# Patient Record
Sex: Male | Born: 1980 | Race: Black or African American | Hispanic: No | Marital: Single | Smoking: Current every day smoker
Health system: Southern US, Community
[De-identification: ages and names within clinical notes are randomized; demographics above are authoritative.]

## PROBLEM LIST (undated history)

## (undated) DIAGNOSIS — F1994 Other psychoactive substance use, unspecified with psychoactive substance-induced mood disorder: Secondary | ICD-10-CM

## (undated) DIAGNOSIS — F319 Bipolar disorder, unspecified: Principal | ICD-10-CM

## (undated) DIAGNOSIS — G4733 Obstructive sleep apnea (adult) (pediatric): Secondary | ICD-10-CM

## (undated) DIAGNOSIS — N529 Male erectile dysfunction, unspecified: Secondary | ICD-10-CM

## (undated) DIAGNOSIS — J0100 Acute maxillary sinusitis, unspecified: Secondary | ICD-10-CM

## (undated) DIAGNOSIS — M549 Dorsalgia, unspecified: Secondary | ICD-10-CM

## (undated) DIAGNOSIS — M5431 Sciatica, right side: Secondary | ICD-10-CM

## (undated) DIAGNOSIS — L0100 Impetigo, unspecified: Secondary | ICD-10-CM

## (undated) DIAGNOSIS — K047 Periapical abscess without sinus: Principal | ICD-10-CM

## (undated) DIAGNOSIS — J4520 Mild intermittent asthma, uncomplicated: Secondary | ICD-10-CM

## (undated) DIAGNOSIS — W3400XA Accidental discharge from unspecified firearms or gun, initial encounter: Secondary | ICD-10-CM

## (undated) DIAGNOSIS — T148XXA Other injury of unspecified body region, initial encounter: Secondary | ICD-10-CM

## (undated) DIAGNOSIS — J45909 Unspecified asthma, uncomplicated: Secondary | ICD-10-CM

## (undated) DIAGNOSIS — F1110 Opioid abuse, uncomplicated: Secondary | ICD-10-CM

## (undated) DIAGNOSIS — I10 Essential (primary) hypertension: Secondary | ICD-10-CM

## (undated) DIAGNOSIS — R1115 Cyclical vomiting syndrome unrelated to migraine: Secondary | ICD-10-CM

## (undated) DIAGNOSIS — R600 Localized edema: Secondary | ICD-10-CM

## (undated) DIAGNOSIS — M7989 Other specified soft tissue disorders: Secondary | ICD-10-CM

## (undated) DIAGNOSIS — J4531 Mild persistent asthma with (acute) exacerbation: Secondary | ICD-10-CM

## (undated) DIAGNOSIS — L0591 Pilonidal cyst without abscess: Secondary | ICD-10-CM

## (undated) DIAGNOSIS — R29818 Other symptoms and signs involving the nervous system: Secondary | ICD-10-CM

## (undated) DIAGNOSIS — Z889 Allergy status to unspecified drugs, medicaments and biological substances status: Secondary | ICD-10-CM

## (undated) DIAGNOSIS — F32A Depression, unspecified: Secondary | ICD-10-CM

---

## 2008-11-25 MED ORDER — METRONIDAZOLE 500 MG TAB
500 mg | ORAL_TABLET | Freq: Two times a day (BID) | ORAL | Status: AC
Start: 2008-11-25 — End: 2008-11-29

## 2008-11-25 MED ORDER — PROPOXYPHENE N-ACETAMINOPHEN 100 MG-650 MG TAB
100-650 mg | ORAL_TABLET | Freq: Four times a day (QID) | ORAL | Status: AC | PRN
Start: 2008-11-25 — End: 2008-12-02

## 2008-11-25 MED ORDER — PENICILLIN V-K 500 MG TAB
500 mg | ORAL_TABLET | Freq: Three times a day (TID) | ORAL | Status: AC
Start: 2008-11-25 — End: 2008-12-02

## 2008-11-25 MED ADMIN — cefTRIAXone (ROCEPHIN) 1 g in lidocaine (PF) (XYLOCAINE) 10 mg/mL (1 %) IM injection: INTRAMUSCULAR | @ 16:00:00 | NDC 63323049205

## 2008-11-25 MED ADMIN — acetaminophen (TYLENOL) tablet 1,000 mg: ORAL | @ 16:00:00 | NDC 51645070610

## 2008-11-25 MED FILL — ACETAMINOPHEN 500 MG TAB: 500 mg | ORAL | Qty: 2

## 2008-11-25 MED FILL — XYLOCAINE-MPF 10 MG/ML (1 %) INJECTION SOLUTION: 10 mg/mL (1 %) | INTRAMUSCULAR | Qty: 5

## 2008-11-25 NOTE — ED Provider Notes (Signed)
I have personally seen and evaluated patient. I find the patient's history and physical exam are consistent with the PA's NP documentation. I agree with the care provided, treatments rendered, disposition and follow up plan.

## 2008-11-25 NOTE — ED Provider Notes (Signed)
Patient is a 28 y.o. male presenting with tooth pain. The history is provided by the patient.   Dental Pain   This is a new problem. The current episode started 2 days ago. The problem occurs constantly. The problem has not changed since onset. The pain is located in the left lower mouth.The quality of the pain is aching.  The pain is at a severity of 9/10. The pain is mild. There was no vomiting, no nausea, no fever, no swelling, no chest pain, no shortness of breath, no headaches and no gum redness. He has tried nothing for the symptoms. The treatment provided no relief. The patient has no cardiac history.       No past medical history on file.     No past surgical history on file.      No family history on file.     History   Social History   ??? Marital Status: Married     Spouse Name: N/A     Number of Children: N/A   ??? Years of Education: N/A   Occupational History   ??? Not on file.   Social History Main Topics   ??? Tobacco Use: Not on file   ??? Alcohol Use: Not on file   ??? Drug Use: Not on file   ??? Sexually Active: Not on file   Other Topics Concern   ??? Not on file   Social History Narrative   ??? No narrative on file           ALLERGIES: Ibuprofen, Hydrocodone, Strawberry and Tomato      Review of Systems   Constitutional: Negative for fever, chills, diaphoresis, appetite change and fatigue.   HENT: Positive for dental problem. Negative for rhinorrhea, mouth sores and trouble swallowing.    Eyes: Negative.    Respiratory: Negative.    Cardiovascular: Negative.    Genitourinary: Negative.    Musculoskeletal: Negative.    Skin: Negative.    Neurological: Negative.        Filed Vitals:    11/25/2008 10:13 AM   BP: 136/88   Pulse: 121   Temp: 99.7 ??F (37.6 ??C)   Resp: 16   Weight: 246 lb (111.585 kg)   SpO2: 99%              Physical Exam   Nursing note and vitals reviewed.  Constitutional: He is oriented to person, place, and time. He appears well-developed and well-nourished.   HENT:    Head: Normocephalic and atraumatic.   Right Ear: External ear normal.   Left Ear: External ear normal.   Mouth/Throat: Oropharynx is clear and moist. No oropharyngeal exudate.        Eyes: Conjunctivae and extraocular motions are normal. Pupils are equal, round, and reactive to light. Right eye exhibits no discharge. Left eye exhibits no discharge. No scleral icterus.   Neck: Normal range of motion. Neck supple. No tracheal deviation present. No thyromegaly present.   Cardiovascular: Normal rate, regular rhythm, normal heart sounds and intact distal pulses.    No murmur heard.  Pulmonary/Chest: Effort normal and breath sounds normal. No respiratory distress. He has no wheezes. He has no rales.   Abdominal: Soft. He exhibits no distension. No tenderness. He has no rebound and no guarding.   Musculoskeletal: Normal range of motion. He exhibits no edema and no tenderness.   Lymphadenopathy:     He has no cervical adenopathy.   Neurological: He is alert and oriented to  person, place, and time. No cranial nerve deficit. Coordination normal.   Skin: Skin is warm. No rash noted. No erythema.   Psychiatric: He has a normal mood and affect. His behavior is normal. Judgment and thought content normal.        Coding    Procedures

## 2008-12-10 NOTE — ED Notes (Signed)
Pt called to treatment area. No answer.

## 2008-12-10 NOTE — ED Notes (Signed)
Patient called twice with no answer.

## 2008-12-11 ENCOUNTER — Inpatient Hospital Stay
Admit: 2008-12-11 | Discharge: 2008-12-11 | Disposition: A | Discharge status: Home / Self Care | Payer: Self-pay | Attending: Emergency Medicine

## 2008-12-11 NOTE — ED Provider Notes (Addendum)
Patient left before being seen

## 2009-01-14 MED ORDER — DIAZEPAM 5 MG TAB
5 mg | ORAL_TABLET | Freq: Every evening | ORAL | Status: AC
Start: 2009-01-14 — End: 2009-01-17

## 2009-01-14 MED ORDER — ACETAMINOPHEN SR 650 MG TAB
650 mg | ORAL_TABLET | Freq: Two times a day (BID) | ORAL | Status: AC
Start: 2009-01-14 — End: 2009-01-19
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
## 2009-01-14 NOTE — ED Provider Notes (Signed)
I have personally seen and evaluated patient. I find the patient's history and physical exam are consistent with the PA's NP documentation. I agree with the care provided, treatments rendered, disposition and follow up plan.
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
## 2009-01-14 NOTE — ED Notes (Signed)
Patient given DC instructions and 2 scripts.  Patient verbalized understanding of instructions and scripts.  Patient alert and oriented and in no acute distress.  Patient ambulated out of ED with self.  Patient given the opportunity to ask questions prior to discharge.

## 2009-12-15 NOTE — ED Notes (Signed)
Pt states, "my doctor is going to call me in something"; denies wanting to be seen in ED anymore

## 2009-12-15 NOTE — ED Notes (Signed)
Pt on phone, reports wanting to finish phone call prior to being triaged; pt speaking with his doctor about getting pain medications

## 2010-02-08 MED ORDER — NAPROXEN 500 MG TAB
500 mg | ORAL_TABLET | Freq: Two times a day (BID) | ORAL | Status: AC
Start: 2010-02-08 — End: 2010-02-11

## 2010-02-08 MED ORDER — ACETAMINOPHEN-CODEINE 300 MG-30 MG TAB
300-30 mg | ORAL_TABLET | Freq: Four times a day (QID) | ORAL | Status: AC | PRN
Start: 2010-02-08 — End: 2010-02-11

## 2010-02-08 MED ORDER — TRIMETHOPRIM-SULFAMETHOXAZOLE 160 MG-800 MG TAB
160-800 mg | ORAL_TABLET | Freq: Two times a day (BID) | ORAL | Status: AC
Start: 2010-02-08 — End: 2010-02-18

## 2010-02-08 MED ORDER — CETIRIZINE 10 MG TAB
10 mg | ORAL_TABLET | Freq: Every day | ORAL | Status: AC
Start: 2010-02-08 — End: 2010-02-15

## 2010-02-08 NOTE — ED Provider Notes (Signed)
I have personally seen and evaluated patient. I find the patient's history and physical exam are consistent with the PA's NP documentation. I agree with the care provided, treatments rendered, disposition and follow up plan.

## 2010-02-08 NOTE — ED Notes (Signed)
Patient (s)  given copy of dc instructions and 4 script(s).  Patient (s)  verbalized understanding of instructions and script (s).  Patient given a current medication reconciliation form and verbalized understanding of their medications.   Patient (s) verbalized understanding of the importance of discussing medications with  his or her physician or clinic they will be following up with.  Patient alert and oriented and in no acute distress.  Patient discharged home ambulatory with self.
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
## 2010-03-25 MED ORDER — IPRATROPIUM-ALBUTEROL 2.5 MG-0.5 MG/3 ML NEB SOLUTION
2.5 mg-0.5 mg/3 ml | Freq: Once | RESPIRATORY_TRACT | Status: AC
Start: 2010-03-25 — End: 2010-03-25
  Administered 2010-03-25: via RESPIRATORY_TRACT
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
## 2010-03-25 NOTE — ED Notes (Signed)
Report received from [facility], RN for transfer of care. Pt currently in X-ray at this time. No distress noted prior to transport.

## 2010-03-25 NOTE — ED Notes (Signed)
The patient is alert and oriented with no respiratory distress observed.Vital signs within normal limits . The patient's diagnosis, condition and treatment were explained to patient. The patient expressed understanding. Prescription (5) given to patient. The patient was discharged home ambulatory in stable condition.  A discharge plan has been developed. A case manager was not involved in the process. Aftercare instructions were given to the patient.

## 2010-03-26 MED ORDER — AZITHROMYCIN 250 MG TAB
250 mg | PACK | ORAL | Status: AC
Start: 2010-03-26 — End: 2010-03-30

## 2010-03-26 MED ORDER — ALBUTEROL 90 MCG/ACTUATION AEROSOL INHALER
90 mcg/actuation | RESPIRATORY_TRACT | Status: AC | PRN
Start: 2010-03-26 — End: 2011-03-20

## 2010-03-26 MED ORDER — PROMETHAZINE-CODEINE 6.25 MG-10 MG/5 ML SYRUP
Freq: Four times a day (QID) | ORAL | Status: AC | PRN
Start: 2010-03-26 — End: 2010-03-30

## 2010-03-26 MED ORDER — FAMOTIDINE 20 MG TAB
20 mg | ORAL_TABLET | Freq: Two times a day (BID) | ORAL | Status: AC
Start: 2010-03-26 — End: 2010-04-04

## 2010-03-26 MED ORDER — PREDNISONE 10 MG TABLETS IN A DOSE PACK
10 mg | ORAL_TABLET | ORAL | Status: DC
Start: 2010-03-26 — End: 2011-01-14

## 2010-03-26 MED ORDER — CETIRIZINE 10 MG TAB
10 mg | ORAL_TABLET | Freq: Every day | ORAL | Status: AC
Start: 2010-03-26 — End: 2010-04-01

## 2010-03-26 NOTE — ED Provider Notes (Signed)
I have personally seen and evaluated patient. I find the patient's history and physical exam are consistent with the PA's NP documentation. I agree with the care provided, treatments rendered, disposition and follow up plan.
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
## 2011-01-14 MED ORDER — PREDNISONE 5 MG TABLETS IN A DOSE PACK
5 mg | ORAL_TABLET | ORAL | Status: DC
Start: 2011-01-14 — End: 2011-08-11

## 2011-01-14 MED ORDER — HYDROCODONE-HOMATROPINE 5 MG-1.5 MG/5 ML ORAL SOLUTION
ORAL | Status: DC | PRN
Start: 2011-01-14 — End: 2011-06-01

## 2011-01-14 MED ORDER — OXYCODONE-ACETAMINOPHEN 5 MG-325 MG TAB
5-325 mg | ORAL | Status: AC
Start: 2011-01-14 — End: 2011-01-14
  Administered 2011-01-14: 20:00:00 via ORAL

## 2011-01-14 NOTE — ED Notes (Signed)
Cast shoe applied
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
## 2011-01-14 NOTE — ED Notes (Signed)
Patient has been given discharge instructions by PA and patient was given the opportunity to ask questions.  Nurse also reinforced discharge instructions and patient has verbalized understanding.

## 2011-01-16 NOTE — ED Provider Notes (Signed)
I was personally available for consultation in the emergency department.  I have reviewed the chart and agree with the documentation recorded by the MLP, including the assessment, treatment plan, and disposition.  Michael S Donella Pascarella, MD

## 2011-06-01 MED ORDER — NAPROXEN 250 MG TAB
250 mg | ORAL | Status: DC
Start: 2011-06-01 — End: 2011-06-01
  Administered 2011-06-01: 05:00:00 via ORAL

## 2011-06-01 MED ORDER — NAPROXEN 250 MG TAB
250 mg | ORAL | Status: AC
Start: 2011-06-01 — End: 2011-06-01
  Administered 2011-06-01: 05:00:00 via ORAL

## 2011-06-01 MED ORDER — NAPROXEN 500 MG TAB
500 mg | ORAL_TABLET | Freq: Two times a day (BID) | ORAL | Status: DC | PRN
Start: 2011-06-01 — End: 2011-08-11

## 2011-06-01 MED ORDER — HYDROCODONE-ACETAMINOPHEN 5 MG-325 MG TAB
5-325 mg | ORAL | Status: AC
Start: 2011-06-01 — End: 2011-06-01
  Administered 2011-06-01: 05:00:00 via ORAL

## 2011-06-01 MED ORDER — HYDROCODONE-HOMATROPINE 5 MG-1.5 MG/5 ML ORAL SOLUTION
Freq: Three times a day (TID) | ORAL | Status: DC | PRN
Start: 2011-06-01 — End: 2011-08-11

## 2011-06-01 MED ORDER — BENZONATATE 200 MG CAP
200 mg | ORAL_CAPSULE | Freq: Three times a day (TID) | ORAL | Status: AC | PRN
Start: 2011-06-01 — End: 2011-06-08

## 2011-06-01 MED FILL — NAPROXEN 250 MG TAB: 250 mg | ORAL | Qty: 2

## 2011-06-01 MED FILL — HYDROCODONE-ACETAMINOPHEN 5 MG-325 MG TAB: 5-325 mg | ORAL | Qty: 2

## 2011-06-01 NOTE — ED Notes (Signed)
Pt discharged to home, instructions reviewed with pt by PA

## 2011-06-01 NOTE — ED Provider Notes (Signed)
I was personally available for consultation in the emergency department.  I have reviewed the chart and agree with the documentation recorded by the MLP, including the assessment, treatment plan, and disposition.  Khara Renaud E Karisha Marlin, MD
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
## 2011-06-01 NOTE — ED Notes (Signed)
Pt to xray
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
## 2011-08-11 NOTE — ED Notes (Signed)
Pt reports to ed complaining of wheezing, shortness of breath, sore throat, and left flank pain onset today

## 2011-08-11 NOTE — ED Notes (Signed)
Dr. Bradshaw reviewed discharge instructions with the patient.  The patient verbalized understanding.  Patient ambulatory out of ED with discharge instructions in hand.

## 2011-08-11 NOTE — ED Notes (Signed)
Patient c/o coughing, drainage in back of throat, sinus headache and chest congestion.

## 2011-08-11 NOTE — ED Notes (Signed)
Patient found to be eating bag of pretzels in bed.  Patient reported he no longer has nausea.

## 2011-08-12 LAB — CK-MB,QUANT.
CK - MB: 1.8 NG/ML (ref 0.5–3.6)
CK-MB Index: 0.3 (ref 0–2.5)

## 2011-08-12 LAB — METABOLIC PANEL, COMPREHENSIVE
A-G Ratio: 1.2 (ref 1.1–2.2)
ALT (SGPT): 25 U/L (ref 12–78)
AST (SGOT): 24 U/L (ref 15–37)
Albumin: 3.8 g/dL (ref 3.5–5.0)
Alk. phosphatase: 94 U/L (ref 50–136)
Anion gap: 6 mmol/L (ref 5–15)
BUN/Creatinine ratio: 13 (ref 12–20)
BUN: 10 MG/DL (ref 6–20)
Bilirubin, total: 0.3 MG/DL (ref 0.2–1.0)
CO2: 31 MMOL/L (ref 21–32)
Calcium: 8.5 MG/DL (ref 8.5–10.1)
Chloride: 105 MMOL/L (ref 97–108)
Creatinine: 0.75 MG/DL (ref 0.45–1.15)
GFR est AA: >60 mL/min/{1.73_m2} (ref >60)
GFR est non-AA: >60 mL/min/{1.73_m2} (ref >60)
Globulin: 3.3 g/dL (ref 2.0–4.0)
Glucose: 120 MG/DL — ABNORMAL HIGH (ref 65–100)
Potassium: 3.7 MMOL/L (ref 3.5–5.1)
Protein, total: 7.1 g/dL (ref 6.4–8.2)
Sodium: 142 MMOL/L (ref 136–145)

## 2011-08-12 LAB — CBC WITH AUTOMATED DIFF
ABS. BASOPHILS: 0 10*3/uL (ref 0.0–0.1)
ABS. EOSINOPHILS: 0.3 10*3/uL (ref 0.0–0.4)
ABS. LYMPHOCYTES: 2.4 10*3/uL (ref 0.8–3.5)
ABS. MONOCYTES: 0.5 10*3/uL (ref 0.0–1.0)
ABS. NEUTROPHILS: 8.2 10*3/uL — ABNORMAL HIGH (ref 1.8–8.0)
BASOPHILS: 0 % (ref 0–1)
EOSINOPHILS: 3 % (ref 0–7)
HCT: 35.2 % — ABNORMAL LOW (ref 36.6–50.3)
HGB: 12.3 g/dL (ref 12.1–17.0)
LYMPHOCYTES: 21 % (ref 12–49)
MCH: 29.3 PG (ref 26.0–34.0)
MCHC: 34.9 g/dL (ref 30.0–36.5)
MCV: 83.8 FL (ref 80.0–99.0)
MONOCYTES: 5 % (ref 5–13)
NEUTROPHILS: 71 % (ref 32–75)
PLATELET: 279 10*3/uL (ref 150–400)
RBC: 4.2 M/uL (ref 4.10–5.70)
RDW: 12.8 % (ref 11.5–14.5)
WBC: 11.4 10*3/uL — ABNORMAL HIGH (ref 4.1–11.1)

## 2011-08-12 LAB — TROPONIN I: Troponin-I, Qt.: <0.04 ng/mL (ref <0.05)

## 2011-08-12 LAB — CK W/ REFLX CKMB: CK: 539 U/L — ABNORMAL HIGH (ref 39–308)

## 2011-08-12 MED ORDER — AZITHROMYCIN 250 MG TAB
250 mg | PACK | ORAL | Status: DC
Start: 2011-08-12 — End: 2011-09-03

## 2011-08-12 MED ORDER — HYDROCODONE 10 MG-CHLORPHENIRAMINE 8 MG/5 ML ORAL SUSP EXTEND.REL 12HR
10-8 mg/5 mL | Freq: Two times a day (BID) | ORAL | Status: DC | PRN
Start: 2011-08-12 — End: 2011-09-03

## 2011-08-12 MED ORDER — PREDNISONE 10 MG TABLETS IN A DOSE PACK
10 mg | ORAL_TABLET | ORAL | Status: DC
Start: 2011-08-12 — End: 2011-09-03

## 2011-08-12 MED ORDER — ONDANSETRON 4 MG TAB, RAPID DISSOLVE
4 mg | ORAL | Status: AC
Start: 2011-08-12 — End: 2011-08-11
  Administered 2011-08-12: 02:00:00 via ORAL

## 2011-08-12 MED FILL — ONDANSETRON 4 MG TAB, RAPID DISSOLVE: 4 mg | ORAL | Qty: 1

## 2011-08-13 LAB — EKG, 12 LEAD, INITIAL
Atrial Rate: 96 {beats}/min
Calculated P Axis: 73 degrees
Calculated R Axis: 40 degrees
Calculated T Axis: 83 degrees
Diagnosis: NORMAL
P-R Interval: 136 ms
Q-T Interval: 310 ms
QRS Duration: 86 ms
QTC Calculation (Bezet): 391 ms
Ventricular Rate: 96 {beats}/min

## 2011-09-03 MED ORDER — HYDROCODONE 10 MG-CHLORPHENIRAMINE 8 MG/5 ML ORAL SUSP EXTEND.REL 12HR
10-8 mg/5 mL | Freq: Two times a day (BID) | ORAL | Status: DC | PRN
Start: 2011-09-03 — End: 2012-01-05

## 2011-09-03 MED ORDER — PENICILLIN V-K 500 MG TAB
500 mg | ORAL_TABLET | Freq: Four times a day (QID) | ORAL | Status: AC
Start: 2011-09-03 — End: 2011-09-13

## 2011-09-03 MED ADMIN — HYDROcodone-acetaminophen (NORCO) 5-325 mg per tablet 1 Tab: ORAL | @ 20:00:00 | NDC 68084036811

## 2011-09-03 MED FILL — HYDROCODONE-ACETAMINOPHEN 5 MG-325 MG TAB: 5-325 mg | ORAL | Qty: 1

## 2011-09-03 NOTE — ED Notes (Signed)
Pain to the top of the right foot.  Denies specific injury.  Pain to left lower molar area; patient states "I think I have an abscess forming".
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
## 2011-09-03 NOTE — ED Provider Notes (Signed)
I was personally available for consultation in the emergency department.  I have reviewed the chart and agree with the documentation recorded by the MLP, including the assessment, treatment plan, and disposition.  Hudson Majkowski R Erasmo Vertz, MD

## 2011-09-03 NOTE — ED Notes (Signed)
Cast shoe applied. Pt ambulates without difficulty. Pt stable for discharge.cc

## 2011-09-10 MED ORDER — OXYCODONE-ACETAMINOPHEN 5 MG-325 MG TAB
5-325 mg | ORAL_TABLET | ORAL | Status: DC | PRN
Start: 2011-09-10 — End: 2012-01-05

## 2011-09-10 MED ORDER — ONDANSETRON 4 MG TAB, RAPID DISSOLVE
4 mg | ORAL_TABLET | Freq: Three times a day (TID) | ORAL | Status: AC | PRN
Start: 2011-09-10 — End: 2011-09-20

## 2011-09-10 MED ORDER — PREDNISONE 20 MG TAB
20 mg | ORAL_TABLET | Freq: Every day | ORAL | Status: AC
Start: 2011-09-10 — End: 2011-09-15

## 2011-09-10 MED ORDER — AMOXICILLIN CLAVULANATE 875 MG-125 MG TAB
875-125 mg | ORAL_TABLET | Freq: Two times a day (BID) | ORAL | Status: DC
Start: 2011-09-10 — End: 2012-01-05

## 2011-09-10 MED ADMIN — predniSONE (DELTASONE) tablet 60 mg: ORAL | @ 19:00:00 | NDC 00054001825

## 2011-09-10 MED ADMIN — oxyCODONE-acetaminophen (PERCOCET) 5-325 mg per tablet 2 Tab: ORAL | @ 19:00:00 | NDC 68084035511

## 2011-09-10 MED ADMIN — amoxicillin-clavulanate (AUGMENTIN) 875-125 mg per tablet 1 Tab: ORAL | @ 19:00:00 | NDC 00781185220

## 2011-09-10 MED FILL — PREDNISONE 20 MG TAB: 20 mg | ORAL | Qty: 3

## 2011-09-10 MED FILL — AMOXICILLIN CLAVULANATE 875 MG-125 MG TAB: 875-125 mg | ORAL | Qty: 1

## 2011-09-10 MED FILL — OXYCODONE-ACETAMINOPHEN 5 MG-325 MG TAB: 5-325 mg | ORAL | Qty: 2

## 2011-09-10 NOTE — ED Notes (Signed)
Triage ntoe: pt arrives for left ear swelling that began a few days ago. On inspection the ear does have swelling. Pt denies any changes in medication or problems with earrings.
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
## 2011-09-20 NOTE — ED Provider Notes (Signed)
I was personally available for consultation in the emergency department.  I have reviewed the chart and agree with the documentation recorded by the MLP, including the assessment, treatment plan, and disposition.  Chairty Toman C Ranell Finelli, MD
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
## 2011-10-09 NOTE — Patient Instructions (Signed)
Upper Respiratory Infection: After Your Visit to the Emergency Room  Your Care Instructions  You were seen in the emergency room for an upper respiratory infection (URI). This is an infection of the nose, sinuses, or throat. Viruses or bacteria can cause URIs. Colds, flu, and sinusitis are examples of URIs. These infections are spread by coughs, sneezes, and close contact with people who have a URI.  Your doctor may have given you antibiotics to treat the infection if it was caused by bacteria. But antibiotics will not help a viral infection. You can treat most infections with home care. This may include drinking lots of fluids and taking over-the-counter medicine for your symptoms.  Even though you have been released from the emergency room, you still need to watch for any problems. The doctor carefully checked you. But sometimes problems can develop later. If you have new symptoms, or if your symptoms do not get better, return to the emergency room or call your doctor right away.  A visit to the emergency room is only one step in your treatment. Even if you feel better, you still need to do what your doctor recommends, such as going to all suggested follow-up appointments and taking medicines exactly as directed. This will help you recover and help prevent future problems.  How can you care for yourself at home?  ?? To prevent dehydration, drink plenty of fluids, enough so that your urine is light yellow or clear like water. Choose water and other caffeine-free clear liquids until you feel better. If you have kidney, heart, or liver disease and have to limit fluids, talk with your doctor before you increase the amount of fluids you drink.   ?? Take acetaminophen (Tylenol) or ibuprofen (Advil, Motrin) for fever or pain. Read and follow all instructions on the label.   ?? If your doctor prescribed antibiotics, take them as directed. Do not stop taking them just because you feel better. You need to take the full course  of antibiotics.   ?? Take cough medicine and a decongestant if your doctor suggests it.   ?? Get plenty of rest.   ?? Use saline (saltwater) nasal washes to help keep your nasal passages open and wash out mucus and bacteria. You can buy saline nose drops at a grocery store or drugstore. Or you can make your own at home by adding 1 teaspoon of salt and 1 teaspoon of baking soda to 2 cups of distilled water. If you make your own, fill a bulb syringe with the solution, insert the tip into your nostril, and squeeze gently. Blow your nose.   ?? Use a vaporizer or humidifier to add moisture to the air in your bedroom. Follow the instructions for cleaning it.   ?? Do not smoke or allow others to smoke around you. If you need help quitting, talk to your doctor about stop-smoking programs and medicines. These can increase your chances of quitting for good.   When should you call for help?  Call 911 if:  ?? You have severe trouble breathing.   Return to the emergency room now if:  ?? You have a fever with stiff neck or a severe headache.   ?? You have signs of needing more fluids. You have sunken eyes, a dry mouth, and pass only a little dark urine.   ?? You cannot keep down fluids or medicine.   Call your doctor today if:  ?? You have a deep cough and a lot of mucus.   ??   You are too tired to eat or drink.   ?? You have a new symptom, such as a sore throat, an earache, or a rash.     Where can you learn more?    Go to http://www.healthwise.net/[facility]   Enter E757 in the search box to learn more about "Upper Respiratory Infection: After Your Visit to the Emergency Room."    ?? 2006-2012 [employer]. Care instructions adapted under license by [employer] (which disclaims liability or warranty for this information). This care instruction is for use with your licensed healthcare professional. If you have questions about a medical condition or this instruction, always ask your healthcare professional. [employer]  disclaims any warranty or liability for your use of this information.  Content Version: 9.3.73196; Last Revised: March 13, 2010

## 2011-11-12 MED ADMIN — oxyCODONE IR (ROXICODONE) tablet 5 mg: ORAL | @ 15:00:00 | NDC 00406055223

## 2011-11-12 MED ADMIN — diazepam (VALIUM) tablet 5 mg: ORAL | @ 15:00:00 | NDC 68084035911

## 2011-11-12 MED FILL — DIAZEPAM 5 MG TAB: 5 mg | ORAL | Qty: 1

## 2011-11-12 MED FILL — OXYCODONE 5 MG TAB: 5 mg | ORAL | Qty: 1

## 2011-11-12 NOTE — ED Notes (Addendum)
Pt off floor to xray

## 2011-11-12 NOTE — ED Provider Notes (Signed)
I was personally available for consultation in the emergency department.  I have reviewed the chart and agree with the documentation recorded by the MLP, including the assessment, treatment plan, and disposition.  Leza Apsey D Casmere Hollenbeck, MD

## 2011-11-12 NOTE — ED Notes (Addendum)
Discharge instructions given to & reviewed with pt by ER provider. Pt verbalized understanding & did not have any additional questions. Pt ambulated upon exit from ER accompanied by RN. Pt placed in care of counselor for transport home. Pt in no apparent distress upon discharge.
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
## 2011-11-12 NOTE — ED Notes (Signed)
Pt resting in bed in no apparent distress. Call bell within reach. Bed in low, locked position.

## 2011-11-12 NOTE — ED Notes (Signed)
Pt back from xray.

## 2011-11-12 NOTE — ED Notes (Signed)
Triage Note:  "My back.  I was helping move furniture yesterday."  Pt ambulatory in triage.

## 2012-01-05 MED ADMIN — diazepam (VALIUM) tablet 5 mg: ORAL | NDC 51079028501

## 2012-01-05 MED ADMIN — ketorolac (TORADOL) injection 60 mg: INTRAMUSCULAR | NDC 63323016201

## 2012-01-05 MED FILL — KETOROLAC TROMETHAMINE 30 MG/ML INJECTION: 30 mg/mL (1 mL) | INTRAMUSCULAR | Qty: 2

## 2012-01-05 MED FILL — DIAZEPAM 5 MG TAB: 5 mg | ORAL | Qty: 1
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
## 2012-01-05 NOTE — Other (Signed)
PAC reviewed discharge instructions with the patient.  The patient verbalized understanding.

## 2012-01-05 NOTE — ED Notes (Signed)
Pt A&Ox4 with discharge, pt ambulatory out with discharge with no complications. No further distress noted with discharge.

## 2012-04-18 NOTE — Telephone Encounter (Signed)
He may have acyclovir. Ov needed for flexeril

## 2012-04-18 NOTE — Telephone Encounter (Signed)
Pt wants rx for acyclovir (he is beginning to have a break out and for cycle[facility]aprinsend to [employer] [facility] [address] [facility] [employer]. Pt ph# [phone]

## 2012-04-18 NOTE — Progress Notes (Signed)
Pt was contacted and advised that is Acyclovir is ready for pick-up.

## 2012-10-20 LAB — INFLUENZA A & B AG (RAPID TEST)
Influenza A Antigen: NEGATIVE
Influenza B Antigen: NEGATIVE

## 2012-10-20 LAB — STREP AG SCREEN, GROUP A: Group A Strep Ag ID: NEGATIVE

## 2012-10-20 MED ADMIN — acetaminophen (TYLENOL) tablet 650 mg: ORAL | @ 22:00:00 | NDC 00904198261

## 2012-10-20 MED ADMIN — ibuprofen (MOTRIN) tablet 600 mg: ORAL | @ 22:00:00 | NDC 68084070311
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
## 2012-10-20 NOTE — ED Notes (Signed)
Dry cough, sweating since last night

## 2012-10-20 NOTE — ED Notes (Signed)
Patient was discharged by PA-C Kirkham with prescriptions and follow-up instructions. Patient left ambulatory with family member.

## 2012-10-21 NOTE — Progress Notes (Signed)
Patient was listed on [facility] Report Data 10/20/12.  Attempted to contact to perform discharge assessment.  Unable to reach patient at either number, unable to leave message.  Will attempt to re-contact patient.

## 2012-10-21 NOTE — Progress Notes (Signed)
John Duke Note:    No abx  ______

## 2012-10-22 LAB — CULTURE, THROAT: Culture result:: NORMAL

## 2012-10-22 NOTE — Progress Notes (Signed)
Quick Note:    Left VM on home number listed, emergency contact number on file not in service. If no callback he should be notified with letter by tomorrow  Ezekiel Slocumb, PA-C    ______

## 2012-10-22 NOTE — Progress Notes (Signed)
Quick Note:    I left a message.  ______

## 2012-10-22 NOTE — Progress Notes (Signed)
Quick Note:    Patient called back, still c/o sore throat but otherwise feels better since ED visit. Denies F/C, N/V/D. Discussed + strep on cx, called in Amoxil 875mg  1 tab PO BID x 10 days #20 at [facility] at [facility].  Ezekiel Slocumb, PA-C    ______

## 2012-11-07 NOTE — Telephone Encounter (Signed)
Pt needs refills on Zanax & Zovirax. Pt # is [phone] or [phone].

## 2012-11-07 NOTE — Telephone Encounter (Signed)
Needs an appt

## 2012-11-10 NOTE — Telephone Encounter (Signed)
I have attempted to contact this patient by phone. No one answered, but  I was able to leave a voicemail message. Pt was instructed to contact the office to scheduled appointment.

## 2012-12-17 LAB — INFLUENZA A & B AG (RAPID TEST)
Influenza A Antigen: NEGATIVE
Influenza B Antigen: NEGATIVE

## 2012-12-17 NOTE — ED Notes (Signed)
Pt given DC instructions and DC by other ED staff prior to VS repeat.

## 2012-12-17 NOTE — ED Notes (Addendum)
Pt c/o n/v, cough, sore throat, chills that started yesterday. Pt reports son just dx with strep throat.
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
## 2013-05-29 MED ORDER — DIPHTH,PERTUS(AC)TETANUS VAC(PF) 2.5 LF UNIT-8 MCG-5 LF/0.5 ML INJ
INTRAMUSCULAR | Status: AC
Start: 2013-05-29 — End: 2013-05-29
  Administered 2013-05-29: 21:00:00 via INTRAMUSCULAR

## 2013-05-29 MED ORDER — LIDOCAINE-EPINEPHRINE 1 %-1:100,000 IJ SOLN
1 %-:00,000 | Freq: Once | INTRAMUSCULAR | Status: AC
Start: 2013-05-29 — End: 2013-05-29
  Administered 2013-05-29: 21:00:00 via SUBCUTANEOUS

## 2013-05-29 MED ORDER — CEPHALEXIN 500 MG CAP
500 mg | ORAL_CAPSULE | Freq: Four times a day (QID) | ORAL | Status: AC
Start: 2013-05-29 — End: 2013-06-05

## 2013-05-29 MED ORDER — CEPHALEXIN 250 MG CAP
250 mg | ORAL | Status: AC
Start: 2013-05-29 — End: 2013-05-29
  Administered 2013-05-29: 21:00:00 via ORAL

## 2013-05-29 MED ORDER — TRIMETHOPRIM-SULFAMETHOXAZOLE 160 MG-800 MG TAB
160-800 mg | ORAL | Status: AC
Start: 2013-05-29 — End: 2013-05-29
  Administered 2013-05-29: 21:00:00 via ORAL

## 2013-05-29 MED ORDER — TRIMETHOPRIM-SULFAMETHOXAZOLE 160 MG-800 MG TAB
160-800 mg | ORAL_TABLET | Freq: Two times a day (BID) | ORAL | Status: AC
Start: 2013-05-29 — End: 2013-06-08

## 2013-05-29 MED ORDER — BUPIVACAINE (PF) 0.5 % (5 MG/ML) IJ SOLN
0.5 % (5 mg/mL) | Freq: Once | INTRAMUSCULAR | Status: AC
Start: 2013-05-29 — End: 2013-05-29
  Administered 2013-05-29: 21:00:00

## 2013-05-29 MED ORDER — HYDROCODONE-ACETAMINOPHEN 5 MG-325 MG TAB
5-325 mg | ORAL | Status: AC
Start: 2013-05-29 — End: 2013-05-29
  Administered 2013-05-29: 21:00:00 via ORAL

## 2013-05-29 MED ORDER — HYDROCODONE-ACETAMINOPHEN 5 MG-325 MG TAB
5-325 mg | ORAL_TABLET | ORAL | Status: AC | PRN
Start: 2013-05-29 — End: 2013-06-03

## 2013-05-29 MED FILL — CEPHALEXIN 250 MG CAP: 250 mg | ORAL | Qty: 2

## 2013-05-29 MED FILL — SENSORCAINE-MPF 0.5 % (5 MG/ML) INJECTION SOLUTION: 0.5 % (5 mg/mL) | INTRAMUSCULAR | Qty: 10

## 2013-05-29 MED FILL — HYDROCODONE-ACETAMINOPHEN 5 MG-325 MG TAB: 5-325 mg | ORAL | Qty: 1

## 2013-05-29 MED FILL — BOOSTRIX TDAP 2.5 LF UNIT-8 MCG-5 LF/0.5 ML INTRAMUSCULAR SYRINGE: INTRAMUSCULAR | Qty: 1

## 2013-05-29 MED FILL — LIDOCAINE-EPINEPHRINE 1 %-1:100,000 IJ SOLN: 1 %-:00,000 | INTRAMUSCULAR | Qty: 20

## 2013-05-29 MED FILL — TRIMETHOPRIM-SULFAMETHOXAZOLE 160 MG-800 MG TAB: 160-800 mg | ORAL | Qty: 1

## 2013-05-29 NOTE — ED Notes (Signed)
Pt discharged by PA.  RN not at bedside at the time of review or discharge.
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
## 2013-06-01 NOTE — Progress Notes (Signed)
Attempted to reach patient via telephone and left message to call Panel Manager. Patient listed on [facility] dated 05/29/2013. Need to complete Initial Intake and Post Assessment after patient had I&D of Right Posterior Thigh Abscess. Will attempt to reach again.

## 2013-09-01 MED ORDER — IBUPROFEN 600 MG TAB
600 mg | ORAL_TABLET | Freq: Four times a day (QID) | ORAL | Status: DC | PRN
Start: 2013-09-01 — End: 2013-11-02

## 2013-09-01 NOTE — ED Notes (Signed)
Pt ambulatory out of ED with discharge instructions and prescriptions in hand given by Mike F., PA; pt verbalized understanding of discharge paperwork and time allotted for questions. VSS. Pt alert and oriented. Pt accompanied by family member.

## 2013-09-01 NOTE — ED Notes (Signed)
Pt given ice pack for comfort.

## 2013-09-01 NOTE — ED Notes (Signed)
Triage: Patient reports RIGHT knee pain.  Patient was running upstairs playing with his daughter and knocked his knee against the wall.  Patient c/o pain and swelling.
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
## 2013-10-08 NOTE — Progress Notes (Signed)
Patient has never returned call. Will close episode as it has been over 30 days and no readmits to ED.

## 2013-11-02 ENCOUNTER — Inpatient Hospital Stay
Admit: 2013-11-02 | Discharge: 2013-11-02 | Disposition: A | Discharge status: Home / Self Care | Payer: MEDICAID | Attending: Emergency Medicine

## 2013-11-02 DIAGNOSIS — N342 Other urethritis: Secondary | ICD-10-CM

## 2013-11-02 MED ORDER — AZITHROMYCIN 250 MG TAB
250 mg | ORAL | Status: AC
Start: 2013-11-02 — End: 2013-11-02
  Administered 2013-11-02: 09:00:00 via ORAL

## 2013-11-02 MED ORDER — DEXTROMETHORPHAN-GUAIFENESIN 15 MG-100 MG/5 ML SYRUP
15-100 mg/5 mL | ORAL | Status: DC | PRN
Start: 2013-11-02 — End: 2014-03-02

## 2013-11-02 MED ORDER — LIDOCAINE (PF) 10 MG/ML (1 %) IJ SOLN
10 mg/mL (1 %) | Freq: Once | INTRAMUSCULAR | Status: AC
Start: 2013-11-02 — End: 2013-11-02
  Administered 2013-11-02: 09:00:00 via INTRAMUSCULAR

## 2013-11-02 MED FILL — AZITHROMYCIN 250 MG TAB: 250 mg | ORAL | Qty: 4

## 2013-11-02 MED FILL — CEFTRIAXONE 1 GRAM SOLUTION FOR INJECTION: 1 gram | INTRAMUSCULAR | Qty: 1

## 2013-11-02 NOTE — ED Notes (Signed)
Patient (s)  given copy of dc instructions and 1 script(s).  Patient (s)  verbalized understanding of instructions and script (s).  Patient given a current medication reconciliation form and verbalized understanding of their medications.   Patient (s) verbalized understanding of the importance of discussing medications with  his or her physician or clinic they will be following up with.  Patient alert and oriented and in no acute distress.  Patient discharged home ambulatory with self.
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
## 2013-11-02 NOTE — ED Notes (Signed)
Pt reports burning, itching w/urination and yellow discharge from penis. Pain 5/10 numeric.

## 2013-11-03 LAB — CHLAMYDIA/GC PCR
Chlamydia amplified: NEGATIVE
N. gonorrhea, amplified: NEGATIVE

## 2013-11-16 ENCOUNTER — Inpatient Hospital Stay
Admit: 2013-11-16 | Discharge: 2013-11-16 | Disposition: A | Discharge status: Home / Self Care | Payer: MEDICAID | Attending: Emergency Medicine

## 2013-11-16 DIAGNOSIS — J209 Acute bronchitis, unspecified: Secondary | ICD-10-CM

## 2013-11-16 MED ORDER — CODEINE-GUAIFENESIN 10 MG-100 MG/5 ML ORAL LIQUID
100-10 mg/5 mL | Freq: Three times a day (TID) | ORAL | Status: DC | PRN
Start: 2013-11-16 — End: 2014-03-02

## 2013-11-16 MED ORDER — SODIUM CHLORIDE 0.9% BOLUS IV
0.9 % | INTRAVENOUS | Status: AC
Start: 2013-11-16 — End: 2013-11-16
  Administered 2013-11-16: 07:00:00 via INTRAVENOUS

## 2013-11-16 MED ORDER — DEXAMETHASONE SODIUM PHOSPHATE 4 MG/ML IJ SOLN
4 mg/mL | INTRAMUSCULAR | Status: AC
Start: 2013-11-16 — End: 2013-11-16
  Administered 2013-11-16: 07:00:00 via INTRAVENOUS

## 2013-11-16 MED ORDER — AMOXICILLIN 500 MG TABLET
500 mg | ORAL_TABLET | Freq: Three times a day (TID) | ORAL | Status: AC
Start: 2013-11-16 — End: 2013-11-26

## 2013-11-16 MED ORDER — SODIUM CHLORIDE 0.9 % IV PIGGY BACK
2 gram | INTRAVENOUS | Status: AC
Start: 2013-11-16 — End: 2013-11-16
  Administered 2013-11-16: 07:00:00 via INTRAVENOUS

## 2013-11-16 MED ORDER — ACETAMINOPHEN 500 MG TAB
500 mg | Freq: Once | ORAL | Status: AC
Start: 2013-11-16 — End: 2013-11-16
  Administered 2013-11-16: 07:00:00 via ORAL

## 2013-11-16 MED FILL — DEXAMETHASONE SODIUM PHOSPHATE 4 MG/ML IJ SOLN: 4 mg/mL | INTRAMUSCULAR | Qty: 5

## 2013-11-16 MED FILL — CEFTRIAXONE 2 GRAM SOLUTION FOR INJECTION: 2 gram | INTRAMUSCULAR | Qty: 2

## 2013-11-16 MED FILL — MAPAP EXTRA STRENGTH 500 MG TABLET: 500 mg | ORAL | Qty: 2

## 2013-11-16 MED FILL — SODIUM CHLORIDE 0.9 % IV: INTRAVENOUS | Qty: 1000

## 2013-11-16 NOTE — ED Notes (Signed)
Pt reports sore throat, poor appetite, productive cough with yellow sputum, diarrhea, vomitting, right ear pain and stuffy. Pain 9/10 numeric in throat.

## 2013-11-16 NOTE — ED Notes (Signed)
Patient (s)  given copy of dc instructions and 2 script(s).  Patient (s)  verbalized understanding of instructions and script (s).  Patient given a current medication reconciliation form and verbalized understanding of their medications.   Patient (s) verbalized understanding of the importance of discussing medications with  his or her physician or clinic they will be following up with.  Patient alert and oriented and in no acute distress.  Patient discharged home ambulatory with self.
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
## 2014-02-26 ENCOUNTER — Inpatient Hospital Stay
Admit: 2014-02-26 | Discharge: 2014-02-26 | Disposition: A | Discharge status: Home / Self Care | Payer: MEDICAID | Attending: Emergency Medicine

## 2014-02-26 DIAGNOSIS — B349 Viral infection, unspecified: Secondary | ICD-10-CM

## 2014-02-26 MED ORDER — PROMETHAZINE 25 MG TAB
25 mg | ORAL_TABLET | Freq: Four times a day (QID) | ORAL | Status: DC | PRN
Start: 2014-02-26 — End: 2014-03-02
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
## 2014-02-26 NOTE — ED Notes (Signed)
Patient (s)  given copy of dc instructions and 1 script(s).  Patient(s)  verbalized understanding of instructions and script (s).  Patient given a current medication reconciliation form and verbalized understanding of their medications.   Patient (s) verbalized understanding of the importance of discussing medications with  his or her physician or clinic when they follow up.  Patient alert and oriented and in no acute distress.  Pt verbalizes pain scale of 0 out of 10.  Patient discharged home ambulatory.

## 2014-03-02 ENCOUNTER — Inpatient Hospital Stay
Admit: 2014-03-02 | Discharge: 2014-03-02 | Disposition: A | Discharge status: Home / Self Care | Payer: MEDICAID | Attending: Internal Medicine

## 2014-03-02 DIAGNOSIS — N342 Other urethritis: Secondary | ICD-10-CM

## 2014-03-02 LAB — CHLAMYDIA/GC PCR
Chlamydia amplified: NEGATIVE
N. gonorrhea, amplified: NEGATIVE

## 2014-03-02 MED ORDER — AZITHROMYCIN 250 MG TAB
250 mg | ORAL | Status: AC
Start: 2014-03-02 — End: 2014-03-02
  Administered 2014-03-02: 08:00:00 via ORAL

## 2014-03-02 MED ORDER — LIDOCAINE (PF) 10 MG/ML (1 %) IJ SOLN
101 mg/mL (1 %) | INTRAMUSCULAR | Status: AC
Start: 2014-03-02 — End: 2014-03-02
  Administered 2014-03-02: 08:00:00 via INTRAMUSCULAR

## 2014-03-02 MED FILL — AZITHROMYCIN 250 MG TAB: 250 mg | ORAL | Qty: 4

## 2014-03-02 MED FILL — XYLOCAINE-MPF 10 MG/ML (1 %) INJECTION SOLUTION: 10 mg/mL (1 %) | INTRAMUSCULAR | Qty: 2
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
## 2014-03-02 NOTE — ED Notes (Signed)
Pain with urination for 3-4 days.

## 2014-03-02 NOTE — ED Notes (Signed)
Patient given copy of dc instructions.  Patient verbalized understanding of instructions.  Patient given a current medication reconciliation form and verbalized understanding of their medications.   Patient verbalized understanding of the importance of discussing medications with  his or her physician or clinic when they follow up.  Patient alert and oriented and in no acute distress.  Pt verbalizes pain scale of 4 out of 10.  Patient discharged home ambulatory without assistance.

## 2014-03-13 ENCOUNTER — Inpatient Hospital Stay
Admit: 2014-03-13 | Discharge: 2014-03-15 | Disposition: A | Discharge status: Home / Self Care | Payer: MEDICAID | Attending: Psychiatry | Admitting: Psychiatry

## 2014-03-13 DIAGNOSIS — F1124 Opioid dependence with opioid-induced mood disorder: Secondary | ICD-10-CM

## 2014-03-13 LAB — CBC WITH AUTOMATED DIFF
ABS. BASOPHILS: 0 10*3/uL (ref 0.0–0.1)
ABS. EOSINOPHILS: 0.1 10*3/uL (ref 0.0–0.4)
ABS. LYMPHOCYTES: 1.6 10*3/uL (ref 0.8–3.5)
ABS. MONOCYTES: 0.5 10*3/uL (ref 0.0–1.0)
ABS. NEUTROPHILS: 8.3 10*3/uL — ABNORMAL HIGH (ref 1.8–8.0)
BASOPHILS: 0 % (ref 0–1)
EOSINOPHILS: 1 % (ref 0–7)
HCT: 36.1 % — ABNORMAL LOW (ref 36.6–50.3)
HGB: 12.5 g/dL (ref 12.1–17.0)
LYMPHOCYTES: 16 % (ref 12–49)
MCH: 27.7 PG (ref 26.0–34.0)
MCHC: 34.6 g/dL (ref 30.0–36.5)
MCV: 80 FL (ref 80.0–99.0)
MONOCYTES: 5 % (ref 5–13)
NEUTROPHILS: 78 % — ABNORMAL HIGH (ref 32–75)
PLATELET: 340 10*3/uL (ref 150–400)
RBC: 4.51 M/uL (ref 4.10–5.70)
RDW: 13.9 % (ref 11.5–14.5)
WBC: 10.5 10*3/uL (ref 4.1–11.1)

## 2014-03-13 LAB — METABOLIC PANEL, BASIC
Anion gap: 8 mmol/L (ref 5–15)
BUN/Creatinine ratio: 12 (ref 12–20)
BUN: 13 MG/DL (ref 6–20)
CO2: 30 mmol/L (ref 21–32)
Calcium: 8.8 MG/DL (ref 8.5–10.1)
Chloride: 105 mmol/L (ref 97–108)
Creatinine: 1.07 MG/DL (ref 0.70–1.30)
GFR est AA: >60 mL/min/{1.73_m2} (ref >60)
GFR est non-AA: >60 mL/min/{1.73_m2} (ref >60)
Glucose: 100 mg/dL (ref 65–100)
Potassium: 4.3 mmol/L (ref 3.5–5.1)
Sodium: 143 mmol/L (ref 136–145)

## 2014-03-13 LAB — DRUG SCREEN, URINE
AMPHETAMINES: NEGATIVE
BARBITURATES: NEGATIVE
BENZODIAZEPINES: POSITIVE — AB
COCAINE: POSITIVE — AB
METHADONE: NEGATIVE
OPIATES: POSITIVE — AB
PCP(PHENCYCLIDINE): NEGATIVE
THC (TH-CANNABINOL): NEGATIVE

## 2014-03-13 LAB — URINALYSIS W/ REFLEX CULTURE
Bacteria: NEGATIVE /hpf
Blood: NEGATIVE
Glucose: NEGATIVE mg/dL
Nitrites: NEGATIVE
Protein: 30 mg/dL — AB
Specific gravity: >1.03 — ABNORMAL HIGH (ref 1.003–1.030)
Urobilinogen: 1 EU/dL (ref 0.2–1.0)
pH (UA): 6 (ref 5.0–8.0)

## 2014-03-13 LAB — BILIRUBIN, CONFIRM: Bilirubin UA, confirm: NEGATIVE

## 2014-03-13 LAB — ETHYL ALCOHOL: ALCOHOL(ETHYL),SERUM: <10 MG/DL (ref <10)

## 2014-03-13 MED ORDER — METHADONE 5 MG TAB
5 mg | Freq: Every day | ORAL | Status: AC
Start: 2014-03-13 — End: 2014-03-15
  Administered 2014-03-14 – 2014-03-15 (×2): via ORAL

## 2014-03-13 MED ORDER — METRONIDAZOLE 250 MG TAB
250 mg | ORAL | Status: AC
Start: 2014-03-13 — End: 2014-03-13
  Administered 2014-03-13: 08:00:00 via ORAL

## 2014-03-13 MED ORDER — LIDOCAINE (PF) 10 MG/ML (1 %) IJ SOLN
10 mg/mL (1 %) | INTRAMUSCULAR | Status: AC
Start: 2014-03-13 — End: 2014-03-13
  Administered 2014-03-13: 07:00:00 via INTRAMUSCULAR

## 2014-03-13 MED ORDER — MAGNESIUM HYDROXIDE 400 MG/5 ML ORAL SUSP
400 mg/5 mL | Freq: Every day | ORAL | Status: DC | PRN
Start: 2014-03-13 — End: 2014-03-15

## 2014-03-13 MED ORDER — LORAZEPAM 1 MG TAB
1 mg | ORAL | Status: DC | PRN
Start: 2014-03-13 — End: 2014-03-15
  Administered 2014-03-15 (×2): via ORAL

## 2014-03-13 MED ORDER — OLANZAPINE 5 MG TAB
5 mg | Freq: Four times a day (QID) | ORAL | Status: DC | PRN
Start: 2014-03-13 — End: 2014-03-15

## 2014-03-13 MED ORDER — PROMETHAZINE 25 MG/ML INJECTION
25 mg/mL | Freq: Four times a day (QID) | INTRAMUSCULAR | Status: DC | PRN
Start: 2014-03-13 — End: 2014-03-15

## 2014-03-13 MED ORDER — ZOLPIDEM 10 MG TAB
10 mg | Freq: Every evening | ORAL | Status: DC | PRN
Start: 2014-03-13 — End: 2014-03-15
  Administered 2014-03-14: 02:00:00 via ORAL

## 2014-03-13 MED ORDER — METHADONE 10 MG TAB
10 mg | Freq: Once | ORAL | Status: AC
Start: 2014-03-13 — End: 2014-03-13
  Administered 2014-03-13: 18:00:00 via ORAL

## 2014-03-13 MED ORDER — ONDANSETRON 4 MG TAB, RAPID DISSOLVE
4 mg | ORAL | Status: AC
Start: 2014-03-13 — End: 2014-03-13
  Administered 2014-03-13: 08:00:00 via ORAL

## 2014-03-13 MED ORDER — ACETAMINOPHEN 325 MG TABLET
325 mg | ORAL | Status: DC | PRN
Start: 2014-03-13 — End: 2014-03-15
  Administered 2014-03-14 – 2014-03-15 (×3): via ORAL

## 2014-03-13 MED ORDER — AZITHROMYCIN 250 MG TAB
250 mg | ORAL | Status: AC
Start: 2014-03-13 — End: 2014-03-13

## 2014-03-13 MED ORDER — CLONIDINE 0.1 MG TAB
0.1 mg | ORAL | Status: DC | PRN
Start: 2014-03-13 — End: 2014-03-15

## 2014-03-13 MED ORDER — BENZTROPINE 2 MG TAB
2 mg | Freq: Two times a day (BID) | ORAL | Status: DC | PRN
Start: 2014-03-13 — End: 2014-03-15

## 2014-03-13 MED ORDER — NICOTINE 21 MG/24 HR DAILY PATCH
21 mg/24 hr | Freq: Every day | TRANSDERMAL | Status: DC | PRN
Start: 2014-03-13 — End: 2014-03-15

## 2014-03-13 MED ORDER — ONDANSETRON 4 MG TAB, RAPID DISSOLVE
4 mg | ORAL | Status: AC
Start: 2014-03-13 — End: 2014-03-13

## 2014-03-13 MED ORDER — LOPERAMIDE 2 MG CAP
2 mg | ORAL | Status: DC | PRN
Start: 2014-03-13 — End: 2014-03-15

## 2014-03-13 MED ORDER — LORAZEPAM 2 MG/ML IJ SOLN
2 mg/mL | INTRAMUSCULAR | Status: DC | PRN
Start: 2014-03-13 — End: 2014-03-15

## 2014-03-13 MED ORDER — AZITHROMYCIN 250 MG TAB
250 mg | ORAL | Status: AC
Start: 2014-03-13 — End: 2014-03-13
  Administered 2014-03-13: 07:00:00 via ORAL

## 2014-03-13 MED ORDER — PROMETHAZINE 25 MG TAB
25 mg | Freq: Four times a day (QID) | ORAL | Status: DC | PRN
Start: 2014-03-13 — End: 2014-03-15
  Administered 2014-03-13 – 2014-03-15 (×4): via ORAL

## 2014-03-13 MED ORDER — CEFTRIAXONE 250 MG SOLUTION FOR INJECTION
250 mg | INTRAMUSCULAR | Status: AC
Start: 2014-03-13 — End: 2014-03-13

## 2014-03-13 MED ORDER — CEFTRIAXONE 250 MG SOLUTION FOR INJECTION
250 mg | INTRAMUSCULAR | Status: AC
Start: 2014-03-13 — End: 2014-03-13
  Administered 2014-03-13: 07:00:00 via INTRAMUSCULAR

## 2014-03-13 MED ORDER — WATER FOR INJECTION, STERILE INJECTION
20. mg/mL (final conc.) | Freq: Two times a day (BID) | INTRAMUSCULAR | Status: DC | PRN
Start: 2014-03-13 — End: 2014-03-15

## 2014-03-13 MED ORDER — BENZTROPINE 1 MG/ML IJ SOLN
1 mg/mL | Freq: Two times a day (BID) | INTRAMUSCULAR | Status: DC | PRN
Start: 2014-03-13 — End: 2014-03-15

## 2014-03-13 MED ORDER — METRONIDAZOLE 250 MG TAB
250 mg | ORAL | Status: AC
Start: 2014-03-13 — End: 2014-03-13

## 2014-03-13 MED ORDER — DICYCLOMINE 10 MG/ML IM SOLN
10 mg/mL | Freq: Four times a day (QID) | INTRAMUSCULAR | Status: DC | PRN
Start: 2014-03-13 — End: 2014-03-15

## 2014-03-13 MED FILL — CEFTRIAXONE 250 MG SOLUTION FOR INJECTION: 250 mg | INTRAMUSCULAR | Qty: 250

## 2014-03-13 MED FILL — METRONIDAZOLE 250 MG TAB: 250 mg | ORAL | Qty: 8

## 2014-03-13 MED FILL — PROMETHAZINE 25 MG TAB: 25 mg | ORAL | Qty: 1

## 2014-03-13 MED FILL — METHADONE 10 MG TAB: 10 mg | ORAL | Qty: 3

## 2014-03-13 MED FILL — AZITHROMYCIN 250 MG TAB: 250 mg | ORAL | Qty: 4

## 2014-03-13 MED FILL — ONDANSETRON 4 MG TAB, RAPID DISSOLVE: 4 mg | ORAL | Qty: 2

## 2014-03-13 MED FILL — XYLOCAINE-MPF 10 MG/ML (1 %) INJECTION SOLUTION: 10 mg/mL (1 %) | INTRAMUSCULAR | Qty: 2
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
## 2014-03-13 NOTE — ED Notes (Signed)
Pt noted to be resting comfortably with lights dimmed. Pt updated on plan of care, has no questions or concerns at this time.

## 2014-03-13 NOTE — Behavioral Health Treatment Team (Cosign Needed)
The patient John Duke is participating in Relaxation Group.    Group time: 30 minutes    Personal goal for participation: Personal    Goal orientation: To learn to relax    Group therapy participation: Active    Therapeutic interventions reviewed and discussed: Yes    [name]  03/13/2014
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
## 2014-03-13 NOTE — Behavioral Health Treatment Team (Signed)
Problem: Depressed Mood (Adult/Pediatric)  Goal: *STG: Participates in treatment plan  Outcome: Progressing Towards Goal  Pt is isolative from the unit today, resting and sleeping in bed without any distress. Pt is meds/meals compliant with good intake. Pt has participated in his treatment team meeting today. Will continue to monitor q 15 to anticipate changes.

## 2014-03-13 NOTE — ED Notes (Signed)
Pt noted to be resting comfortably, given a bagged meal and ginger ale. Pt updated on plan of care, has no questions or concerns at this time.
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
## 2014-03-13 NOTE — Behavioral Health Treatment Team (Deleted)
The patient John Duke is participating in Relaxation Group.    Group time: 30 minutes    Personal goal for participation: Personal    Goal orientation: To learn to relax    Group therapy participation: Active    Therapeutic interventions reviewed and discussed: Yes    [name]  03/13/2014
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
## 2014-03-13 NOTE — Behavioral Health Treatment Team (Signed)
Problem: Depressed Mood (Adult/Pediatric)  Goal: *STG: Participates in treatment plan  Outcome: Progressing Towards Goal  Pt is mostly resting and sleeping in bed tonight, mood is stable. Pt is meds/meals compliant eating and sleeping well. Pt voiced no complaints. Will continue to monitor q 15 to anticipate changes.
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
## 2014-03-13 NOTE — ED Notes (Signed)
[facility] in ED

## 2014-03-13 NOTE — ED Notes (Signed)
[facility] in ED to assess.

## 2014-03-13 NOTE — Behavioral Health Treatment Team (Cosign Needed)
GROUP THERAPY PROGRESS NOTE    The patient John Duke is participating in Reflection Group.    Group time: 30 minutes    Personal goal for participation: Personal    Goal orientation: Reflection    Group therapy participation: Active    Therapeutic interventions reviewed and discussed: Yes    [name]  03/13/2014

## 2014-03-13 NOTE — Other (Signed)
TRANSFER - OUT REPORT:    Verbal report given to C. Roebuck, RN on John Duke  being [facility] for routine progression of care       Report consisted of patient???s Situation, Background, Assessment and   Recommendations(SBAR).     Information from the following report(s) SBAR, ED Summary, [facility] and Recent Results was reviewed with the receiving nurse.    Lines:       Opportunity for questions and clarification was provided.      Patient [facility]:  [facility]

## 2014-03-13 NOTE — ED Notes (Addendum)
Emergency Department Nursing Plan of Care       The Nursing Plan of Care is developed from the Nursing assessment and Emergency Department Attending provider initial evaluation.  The plan of care may be reviewed in the ???ED Provider note???.    The Plan of Care was developed with the following considerations:   Patient / Family readiness to learn indicated ZO:[id number]:verbalized understanding  Persons(s) to be included in education: patient  Barriers to Learning/Limitations:No    Signed     Valentina ShaggyChristina M Dara Beidleman, RN    03/13/2014   04:30 AM

## 2014-03-14 MED ORDER — METRONIDAZOLE 250 MG TAB
250 mg | ORAL | Status: AC
Start: 2014-03-14 — End: 2014-03-14
  Administered 2014-03-14: 16:00:00 via ORAL

## 2014-03-14 MED ORDER — ALBUTEROL SULFATE HFA 90 MCG/ACTUATION AEROSOL INHALER
90 mcg/actuation | RESPIRATORY_TRACT | Status: DC | PRN
Start: 2014-03-14 — End: 2014-03-15

## 2014-03-14 MED ORDER — ACYCLOVIR 200 MG CAP
200 mg | Freq: Four times a day (QID) | ORAL | Status: DC
Start: 2014-03-14 — End: 2014-03-15
  Administered 2014-03-14 – 2014-03-15 (×6): via ORAL

## 2014-03-14 MED FILL — VENTOLIN HFA 90 MCG/ACTUATION AEROSOL INHALER: 90 mcg/actuation | RESPIRATORY_TRACT | Qty: 18

## 2014-03-14 MED FILL — MAPAP (ACETAMINOPHEN) 325 MG TABLET: 325 mg | ORAL | Qty: 2

## 2014-03-14 MED FILL — ACYCLOVIR 200 MG CAP: 200 mg | ORAL | Qty: 2

## 2014-03-14 MED FILL — PROMETHAZINE 25 MG TAB: 25 mg | ORAL | Qty: 1

## 2014-03-14 MED FILL — METHADONE 5 MG TAB: 5 mg | ORAL | Qty: 1

## 2014-03-14 MED FILL — ZOLPIDEM 10 MG TAB: 10 mg | ORAL | Qty: 1

## 2014-03-14 MED FILL — METRONIDAZOLE 250 MG TAB: 250 mg | ORAL | Qty: 8

## 2014-03-14 NOTE — Behavioral Health Treatment Team (Cosign Needed)
GROUP THERAPY PROGRESS NOTE    The patient John Duke is participating in Reflection Group.    Group time: 30 minutes    Personal goal for participation: Personal    Goal orientation: Reflection    Group therapy participation: Active    Therapeutic interventions reviewed and discussed: Yes    [name]  03/14/2014

## 2014-03-14 NOTE — Progress Notes (Signed)
Problem: Suicide/Homicide (Adult/Pediatric)  Goal: *STG: Remains safe in hospital  Outcome: Progressing Towards Goal  John Duke slept approximately 7.0 hours during the night. He was monitored for safety q 15 minutes during safety checks. The patient was observed to be resting with eyes closed, even respirations, and no signs of distress.

## 2014-03-14 NOTE — Behavioral Health Treatment Team (Cosign Needed)
The patient John Duke is participating in Relaxation Group.    Group time: 30 minutes    Personal goal for participation: Personal    Goal orientation: To learn to relax    Group therapy participation: Active    Therapeutic interventions reviewed and discussed: Yes    [name]  03/14/2014-
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
## 2014-03-14 NOTE — Behavioral Health Treatment Team (Signed)
Problem: Depressed Mood (Adult/Pediatric)  Goal: *STG: Participates in treatment plan  Outcome: Progressing Towards Goal  Pt is more visible in the unit today, affect brighter, mood calmer. Pt is meds/meals compliant, able to come to groups in the unit. Pt has participated in his treatment team meeting today. Pt is going through a safe detox from his heroin abuse. Will continue to monitor q 15 to anticipate changes.

## 2014-03-14 NOTE — Behavioral Health Treatment Team (Signed)
Problem: Depressed Mood (Adult/Pediatric)  Goal: *STG: Participates in treatment plan  Outcome: Progressing Towards Goal  Pt is more visible in the unit tonight, affect brighter, mood remains stable, no psychosis noted. Pt is meds/meals compliant with super good appetite. Pt requested and received double portion of his meals. Pt is able to ome to groups in the unit. Pt interacts well with staff in the unit. Will continue to monitor q 15 to anticipate changes.

## 2014-03-14 NOTE — Behavioral Health Treatment Team (Signed)
Social Work Psychosocial Assessment     Pt is a 34 year old male who [facility] to [facility] mental status after being non-med-compliant for 3 weeks. Pt said he used heroin and cocaine to self medicate. Pt was hearing voices telling him to hurt himself with occasional voices telling him to hurt others. Pt was in the ER along with his wife who presented with similar symptoms, [facility]. Pt has 3 children, the older two are with their mother. His baby is at his wife's mother. Pt's diagnosis is substance induced mood disorder. He tested positive for benzos, cocaine, and opiates.    Pt is cooperative, guarded and anxious. Pt denies suicidal thoughts. He said the voices are decreasing.     Pt completed the 11th grade. He denies legal charges. He was raised [place].     [place] [address] [place]. His mother, Guadlupe SpanishWillette Bryant [phone] is aware of pt's admission and he did not want her called. Pt follows up at [facility], [address], [address], [place], [place] [id number] Phone: [phone]. His therapist is Duwayne HeckDanielle and his Psychiatrist is [facility]ama Sayed.    S. Rysinski, LCSW

## 2014-03-15 MED FILL — ACYCLOVIR 200 MG CAP: 200 mg | ORAL | Qty: 2

## 2014-03-15 MED FILL — MAPAP (ACETAMINOPHEN) 325 MG TABLET: 325 mg | ORAL | Qty: 2

## 2014-03-15 MED FILL — PROMETHAZINE 25 MG TAB: 25 mg | ORAL | Qty: 1

## 2014-03-15 MED FILL — LORAZEPAM 1 MG TAB: 1 mg | ORAL | Qty: 1

## 2014-03-15 MED FILL — METHADONE 5 MG TAB: 5 mg | ORAL | Qty: 1

## 2014-03-15 NOTE — Progress Notes (Signed)
Problem: Chemical Dependency (Adult/Pediatric)  Goal: *STG: Remains safe in hospital  Outcome: Progressing Towards Goal  Pt has been observed throughout the night sleeping in bed with even respirations. Total hours slept approximately  7 - 8. No s/s of distress noted. Patient has remained safe. Will continue to monitor.
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
## 2014-03-15 NOTE — Discharge Summary (Signed)
PSYCHIATRIC DISCHARGE SUMMARY         IDENTIFICATION:    Patient Name  John Duke   Date of Birth September 12, 1980   CSN [id number]   Medical Record Number  [id number]      Age  34 y.o.   PCP None   Admit date:  03/13/2014    Discharge date: 03/15/2014   Room Number  322/01  [facility]   Date of Service  03/15/2014            TYPE OF DISCHARGE: REGULAR               CONDITION AT DISCHARGE: good       PROVISIONAL & DISCHARGE DIAGNOSES:    Problem List  Never Reviewed          Codes Class    Polysubstance dependence (HCC) ICD-10-CM: F19.20  ICD-9-CM: 304.80         Malingering ICD-10-CM: Z76.5  ICD-9-CM: V65.2         Opioid dependence (HCC) ICD-10-CM: F11.20  ICD-9-CM: 304.00         Cocaine dependence (HCC) ICD-10-CM: F14.20  ICD-9-CM: 304.20         * (Principal)Substance induced mood disorder (HCC) ICD-10-CM: F19.94  ICD-9-CM: 292.84         UTI (urinary tract infection) ICD-10-CM: N39.0  ICD-9-CM: 599.0             Active Hospital Problems    Polysubstance dependence (HCC)      Malingering      Opioid dependence (HCC)      Cocaine dependence (HCC)      *Substance induced mood disorder (HCC)      UTI (urinary tract infection)        DISCHARGE DIAGNOSIS:   Axis I:  SEE ABOVE  Axis II: SEE ABOVE  Axis III: SEE ABOVE  Axis IV:  No job, social effects of polysubstance dependence, lack of structure  Axis V:  60 on admission, 70 on discharge (baseline)       CC & HISTORY OF PRESENT ILLNESS:  "Pt admitted under a voluntary basis for severe depression with suicidal ideations proving to be an imminent danger to self.      The patient, John Duke, is a 34 y.o.  BL[place] male with a past psychiatric history significant for polysubstance abuse/dependence, who presents at this time for an exacerbation of the principle diagnosis of <principal problem not specified>. Patient reports/evidences the following emotional symptoms:  depression and  anxiety.  Additional symptomatology include concern about health problems, feeling depressed and tearfulness.  The above symptoms have been present for 3 weeks. These symptoms are of moderate severity per patient's report. The symptoms are intermittent/ fleeting in nature.   The patient's condition has been precipitated by  and made worse by continued illicit drug use as well as treatment noncompliance. UDS Positive for Opiates, Cocaine and Benzos BAL=0."       SOCIAL HISTORY:    History     Social History   ??? Marital Status: SINGLE   Spouse Name: N/A   ??? Number of Children: N/A   ??? Years of Education: N/A     Occupational History   ??? Not on file.     Social History Main Topics ??? Smoking status: Current Every Day Smoker -- 1.00 packs/day     Types: Cigarettes   ??? Smokeless tobacco: Not on file   ??? Alcohol Use: No   ???  Drug Use: Yes     Special: Benzodiazepines, Cocaine, Heroin, Other, Prescription      Comment: never in rehab before. started heroin in 2003. also abuses seroquel.    ??? Sexual Activity:     Partners: Female     Copy: None     Other Topics Concern   ??? Not on file     Social History Narrative    Married, she was admitted to [facility] for same problems. m x 4 yrs, married x 2. Has 3 children, 12, 7, 1 y/o. On SSDI since 2010. 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 On probations. Longest in jail 1 yr for robbery, attempted murder--found not guilty.       FAMILY HISTORY:   History reviewed. No pertinent family history.          HOSPITALIZATION COURSE:    John Duke was admitted to the inpatient psychiatric unit [facility] for acute psychiatric stabilization in regards to symptomatology as described in the HPI above.   While on the unit John Duke was involved in individual, group, occupational and milieu therapy.  Psychiatric medications were adjusted during this hospitalization including methadone for detox purposes, no need for psych meds otherwise.     John Duke demonstrated a progressive improvement in overall condition.  Much of patient's depression appeared to be related to situational stressors, effects of drugs of abuse, and psychological factors.  Please see individual progress notes for more specific details regarding patient's hospitalization course.   At time of discharge, John Duke was without significant problems with depression and no evidence whatsoever of  psychosis or mania past or present. Patient free of suicidal and homicidal ideations (appears to be at very low risk of suicide or homicide) and reports many positive predictive factors in terms of not attempting suicide or homicide. Overall presentation at time of discharge is most consistent with the diagnosis of malingering.  Patient has maximized benefit to be derived from acute inpatient psychiatric treatment.  All members of the treatment team concur with each other in regards to plans for discharge today .  Patient aware and in agreement with discharge and discharge plan.         LABS AND IMAGAING:    Labs Reviewed   URINALYSIS W/ REFLEX CULTURE - Abnormal; Notable for the following:     Appearance CLOUDY (*)     Specific gravity >1.030 (*)     Protein 30 (*)     Ketone TRACE (*)     Leukocyte Esterase MODERATE (*)     UA:UC IF INDICATED URINE CULTURE ORDERED (*)     Trichomonas PRESENT (*)     All other components within normal limits   DRUG SCREEN, URINE - Abnormal; Notable for the following:     BENZODIAZEPINE POSITIVE (*)     COCAINE POSITIVE (*)     OPIATES POSITIVE (*)     All other components within normal limits   CBC WITH AUTOMATED DIFF - Abnormal; Notable for the following:     HCT 36.1 (*)     NEUTROPHILS 78 (*)     ABS. NEUTROPHILS 8.3 (*)     All other components within normal limits   CHLAMYDIA/GC AMPLIFIED   METABOLIC PANEL, BASIC   ETHYL ALCOHOL   BILIRUBIN, CONFIRM     No results found for: DS35, PHEN, PHENO, PHENT, DILF, DS39, PHENY, PTN,  VALF2, VALAC, VALP, VALPR, DS6, CRBAM, CRBAMP, CARB2, XCRBAM  Admission on 03/13/2014   Component Date Value Ref Range Status   ??? Color 03/13/2014 DARK YELLOW   Final   ??? Appearance 03/13/2014 CLOUDY* CLEAR   Final   ??? Specific gravity 03/13/2014 >1.030* 1.003 - 1.030 Final   ??? pH (UA) 03/13/2014 6.0  5.0 - 8.0   Final   ??? Protein 03/13/2014 30* NEG mg/dL Final   ??? Glucose 16/10/9602 NEGATIVE   NEG mg/dL Final   ??? Ketone 03/13/2014 TRACE* NEG mg/dL Final   ??? Blood 54/09/8117 NEGATIVE   NEG   Final   ??? Urobilinogen 03/13/2014 1.0  0.2 - 1.0 EU/dL Final   ??? Nitrites 14/78/2956 NEGATIVE   NEG   Final   ??? Leukocyte Esterase 03/13/2014 MODERATE* NEG   Final   ??? WBC 03/13/2014 5-10  0 - 4 /hpf Final   ??? RBC 03/13/2014 5-10  0 - 5 /hpf Final   ??? Epithelial cells 03/13/2014 FEW  FEW /lpf Final   ??? Bacteria 03/13/2014 NEGATIVE   NEG /hpf Final   ??? UA:UC IF INDICATED 03/13/2014 URINE CULTURE ORDERED* CNI   Final   ??? Trichomonas 03/13/2014 PRESENT* NEG   Final   ??? AMPHETAMINE 03/13/2014 NEGATIVE   NEG   Final   ??? BARBITURATES 03/13/2014 NEGATIVE   NEG   Final   ??? BENZODIAZEPINE 03/13/2014 POSITIVE* NEG   Final   ??? COCAINE 03/13/2014 POSITIVE* NEG   Final   ??? METHADONE 03/13/2014 NEGATIVE   NEG   Final   ??? OPIATES 03/13/2014 POSITIVE* NEG   Final   ??? PCP(PHENCYCLIDINE) 03/13/2014 NEGATIVE   NEG   Final   ??? THC (TH-CANNABINOL) 03/13/2014 NEGATIVE   NEG   Final   ??? Drug screen comment 03/13/2014 (NOTE)   Final   ??? WBC 03/13/2014 10.5  4.1 - 11.1 K/uL Final   ??? RBC 03/13/2014 4.51  4.10 - 5.70 M/uL Final   ??? HGB 03/13/2014 12.5  12.1 - 17.0 g/dL Final   ??? HCT 21/30/8657 36.1* 36.6 - 50.3 % Final   ??? MCV 03/13/2014 80.0  80.0 - 99.0 FL Final   ??? MCH 03/13/2014 27.7  26.0 - 34.0 PG Final   ??? MCHC 03/13/2014 34.6  30.0 - 36.5 g/dL Final   ??? RDW 84/69/6295 13.9  11.5 - 14.5 % Final   ??? PLATELET 03/13/2014 340  150 - 400 K/uL Final   ??? NEUTROPHILS 03/13/2014 78* 32 - 75 % Final   ??? LYMPHOCYTES 03/13/2014 16  12 - 49 % Final    ??? MONOCYTES 03/13/2014 5  5 - 13 % Final   ??? EOSINOPHILS 03/13/2014 1  0 - 7 % Final   ??? BASOPHILS 03/13/2014 0  0 - 1 % Final   ??? ABS. NEUTROPHILS 03/13/2014 8.3* 1.8 - 8.0 K/UL Final   ??? ABS. LYMPHOCYTES 03/13/2014 1.6  0.8 - 3.5 K/UL Final   ??? ABS. MONOCYTES 03/13/2014 0.5  0.0 - 1.0 K/UL Final   ??? ABS. EOSINOPHILS 03/13/2014 0.1  0.0 - 0.4 K/UL Final   ??? ABS. BASOPHILS 03/13/2014 0.0  0.0 - 0.1 K/UL Final   ??? Sodium 03/13/2014 143  136 - 145 mmol/L Final   ??? Potassium 03/13/2014 4.3  3.5 - 5.1 mmol/L Final   ??? Chloride 03/13/2014 105  97 - 108 mmol/L Final   ??? CO2 03/13/2014 30  21 - 32 mmol/L Final   ??? Anion gap 03/13/2014 8  5 - 15 mmol/L Final   ??? Glucose  03/13/2014 100  65 - 100 mg/dL Final   ??? BUN 01/31/7251 13  6 - 20 MG/DL Final   ??? Creatinine 03/13/2014 1.07  0.70 - 1.30 MG/DL Final   ??? BUN/Creatinine ratio 03/13/2014 12  12 - 20   Final   ??? GFR est AA 03/13/2014 >60  >60 ml/min/1.29m2 Final   ??? GFR est non-AA 03/13/2014 >60  >60 ml/min/1.42m2 Final   ??? Calcium 03/13/2014 8.8  8.5 - 10.1 MG/DL Final   ??? ALCOHOL(ETHYL),SERUM 03/13/2014 <10  <10 MG/DL Final   ??? Bilirubin UA, confirm 03/13/2014 NEGATIVE   NEG   Final   Admission on 03/02/2014, Discharged on 03/02/2014   Component Date Value Ref Range Status   ??? Sample type 03/02/2014 SWAB   Final   ??? Source 03/02/2014 URETHRAL   Final   ??? Chlamydia amplified 03/02/2014 NEGATIVE   NEG   Final   ??? N. gonorrhea, amplified 03/02/2014 NEGATIVE   NEG   Final   ??? Comment 03/02/2014 (NOTE)   Final     No results found.                DISPOSITION:    Home. Patient to f/u with o/p drug/etoh rehabilitation, and psychotherapy appointments. Patient is to f/u with internist as directed.               FOLLOW-UP CARE:    Activity as tolerated  Resume previous diet  Wound Care: none needed.  Follow-up Information     Follow up With Details Comments Contact Info    None   None ([phone]) Patient stated that they have no PCP                   PROGNOSIS:    Guarded / Poor---- based on nature of patient's pathology/ies.  Prognosis is greatly dependent upon patient's ability to remain sober and to f/u with o/p drug/etoh rehabilitation and psychotherapy appointments as well as to comply with psychiatric medications as prescribed.            DISCHARGE MEDICATIONS:     Informed consent given for the use of following psychotropic medications:  Current Discharge Medication List    CONTINUE these medications which have NOT CHANGED    Details   albuterol (PROVENTIL HFA, VENTOLIN HFA) 90 mcg/actuation inhaler Take 2 Puffs by inhalation every four (4) hours as needed for Wheezing.  Qty: 1 Inhaler, Refills: 11         STOP taking these medications       QUEtiapine (SEROQUEL) 100 mg tablet Comments:   Reason for Stopping: addictive        sertraline (ZOLOFT) 50 mg tablet Comments:   Reason for Stopping:         ALPRAZolam (XANAX) 1 mg tablet Comments:   Reason for Stopping: addictive                     A coordinated, multidisplinary treatment team round was conducted with John Duke is done daily here at [facility]. This team consists of the nurse, psychiatric unit pharmcist, Administrator.     I have spent greater than 35 minutes on discharge work.    Signed:  Mackie Pai, MD  03/15/2014

## 2014-03-15 NOTE — Behavioral Health Treatment Team (Cosign Needed)
COMMUNITY GROUP THERAPY PROGRESS NOTE    The patient John Duke is participating in the Community Therapy Group.     Group time: 30 minutes    Personal goal for participation: To review unit guidelines and treatment plan    Goal orientation: personal    Group therapy participation: active    Therapeutic interventions reviewed and discussed: Yes    Impression of participation:  Positive input.    Ralph DowdyULETTE J ALLEN  03/15/2014

## 2014-03-15 NOTE — Behavioral Health Treatment Team (Signed)
Pt is alert and oriented x person, place and time. Pt denies any SI/HI or AV hallucinations. Pt denies any depression. Pt plans to return home with wife. Discharge information reviewed with patient. Pt verbalizes understanding. Pt's belongings/valuables returned. Pt to be transported home by bus.

## 2014-03-16 LAB — CHLAMYDIA/GC PCR
Chlamydia amplified: NEGATIVE
N. gonorrhea, amplified: NEGATIVE

## 2014-07-04 ENCOUNTER — Inpatient Hospital Stay
Admit: 2014-07-04 | Discharge: 2014-07-04 | Disposition: A | Discharge status: Home / Self Care | Payer: MEDICAID | Attending: Emergency Medicine

## 2014-07-04 DIAGNOSIS — F1123 Opioid dependence with withdrawal: Secondary | ICD-10-CM

## 2014-07-04 MED ORDER — ONDANSETRON (PF) 4 MG/2 ML INJECTION
4 mg/2 mL | INTRAMUSCULAR | Status: DC
Start: 2014-07-04 — End: 2014-07-04

## 2014-07-04 MED ORDER — ONDANSETRON 4 MG TAB, RAPID DISSOLVE
4 mg | ORAL | Status: AC
Start: 2014-07-04 — End: 2014-07-04
  Administered 2014-07-04: 18:00:00 via ORAL

## 2014-07-04 MED ORDER — ONDANSETRON 8 MG TAB, RAPID DISSOLVE
8 mg | ORAL_TABLET | Freq: Three times a day (TID) | ORAL | Status: DC | PRN
Start: 2014-07-04 — End: 2014-07-22

## 2014-07-04 MED ORDER — CLONIDINE 0.1 MG TAB
0.1 mg | ORAL_TABLET | Freq: Two times a day (BID) | ORAL | Status: AC
Start: 2014-07-04 — End: 2014-07-09

## 2014-07-04 MED ORDER — DICYCLOMINE 10 MG/ML IM SOLN
10 mg/mL | INTRAMUSCULAR | Status: AC
Start: 2014-07-04 — End: 2014-07-04
  Administered 2014-07-04: 18:00:00 via INTRAMUSCULAR

## 2014-07-04 MED ORDER — CLONIDINE 0.1 MG TAB
0.1 mg | ORAL | Status: AC
Start: 2014-07-04 — End: 2014-07-04
  Administered 2014-07-04: 18:00:00 via ORAL

## 2014-07-04 MED ORDER — SODIUM CHLORIDE 0.9% BOLUS IV
0.9 % | Freq: Once | INTRAVENOUS | Status: DC
Start: 2014-07-04 — End: 2014-07-04

## 2014-07-04 MED FILL — SODIUM CHLORIDE 0.9 % IV: INTRAVENOUS | Qty: 1000

## 2014-07-04 MED FILL — CLONIDINE 0.1 MG TAB: 0.1 mg | ORAL | Qty: 2

## 2014-07-04 MED FILL — BENTYL 10 MG/ML INTRAMUSCULAR SOLUTION: 10 mg/mL | INTRAMUSCULAR | Qty: 2

## 2014-07-04 MED FILL — ONDANSETRON 4 MG TAB, RAPID DISSOLVE: 4 mg | ORAL | Qty: 2
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
## 2014-07-04 NOTE — ED Notes (Addendum)
TRIAGE NOTE: "I've been off my mental medications. They wasn't working. I've been real depressed. Homicidal thoughts of hurting people and myself (denies plan). I had a real bad panic attack yesterday. I've been self medicating with OC30's, I last used Friday so I might be withdrawing; shakes and aches and vomiting."

## 2014-07-04 NOTE — ED Notes (Signed)
Patient verbalizes understanding of discharge instructions. Pt alert and oriented, appears in no acute distress, respirations equal and unlabored. Ambulatory upon discharge with steady gait.

## 2014-07-04 NOTE — Other (Signed)
Comprehensive Assessment Form Part 1      Section I - Disposition    Axis I - Bipolar, Schizophrenia (per patient)   Axis II - deferred  Axis III - none  Axis IV - Moderate: mental health, financial, substance abuse  Axis V - 60      The Medical Doctor to Psychiatrist conference was not completed.  The Medical Doctor is in agreement with Psychiatrist disposition because of (reason) this pt is not meeting acute criteria.  The plan is for this pt to follow up with Dr. Leilani Merl.  The on-call Psychiatrist consulted was Dr. Wallace Cullens.  The admitting Psychiatrist will be Dr. none.  The admitting Diagnosis is none.  The Payor source is pt on disability.  The name of the representative was none.  This was not approved.     Section II - Integrated Summary  Summary:  This is a 34 year old male that comes in today due to increased symptoms of depression.  Pt. Reports that he has been off his prescribed medication for the past 2 weeks, by Dr. Leilani Merl.  Pt. Was put on Seroquel, Zoloft and Xanax.  Pt. States his medication wasn't working so he stopped taking them.  He reports he has since been self medicating using Oxycotton and Heroin.  Pt. Reports that he used 4-5 30 Ocycotton tabs on Friday and over a gram of Heroin. Since that time, pt. States that he has had "cramping, back hurting, joint pain and feels really depressed."  Pt reports he has history of using Heroin as his "drug of choice."  Pt. Reports having suicidal ideations and homicidal thoughts but no plan.  Pt. States he has been very agitated and easily angered.  Pt. Reports he spoke with his therapist last week to inform her of his decision to stop taking medication.  He has not informed his psychiatrist that he stopped taking medication.  Pt is alert and oriented X3.  Pt. Is not reporting any delusions or psychosis.  Pt is not identifying any specific suicidal or homicidal plan. Pt. Lives with his mother who is with pt in the ER.     The patienthas demonstrated mental capacity to provide informed consent.  The information is given by the patient and parent.  The Chief Complaint is mental health.  The Precipitant Factors are suicidal ideations.  Previous Hospitalizations: unknown  The patient has not previously been in restraints.  Current Psychiatrist and/or Case Manager is Dr. Leilani Merl.    Lethality Assessment:    The potential for suicide noted by the following: ideation with no plan .  The potential for homicide is not noted.  The patient has not been a perpetrator of sexual or physical abuse.  There are not pending charges.  The patient is not felt to be at risk for self harm or harm to others.  The attending nurse was advised follow ED protocol.    Section III - Psychosocial  The patient's overall mood and attitude is calm and cooperative.  Feelings of helplessness and hopelessness are not observed.  Generalized anxiety is not observed.  Panic is not observed. Phobias are not observed.  Obsessive compulsive tendencies are not observed.      Section IV - Mental Status Exam  The patient's appearance shows no evidence of impairment.  The patient's behavior shows no evidence of impairment. The patient is oriented to time, place, person and situation.  The patient's speech shows no evidence of impairment.  The patient's mood  is quiet and somewhat withdrawn.  The range of affect shows no evidence of impairment.  The patient's thought content demonstrates no evidence of impairment.  The thought process shows no evidence of impairment.  The patient's perception shows no evidence of impairment. The patient's memory shows no evidence of impairment.  The patient's appetite shows no evidence of impairment.  The patient's sleep shows no evidence of impairment. The patient shows little insight.  The patient's judgement shows no evidence of impairment.                  Section V - Substance Abuse   The patient is using substances.  The patient is using heroin unknown for 1-5 years with last use on friday and other opiates  orally for 1-5 years with last use on friday. The patient has experienced the following withdrawal symptoms: body aches.      Section VI - Living Arrangements  The patient is single.  The patient lives with a parent. The patient has unknown.  The patient does plan to return home upon discharge.  The patient does not have legal issues pending. The patient's source of income comes from disability.  Religious and cultural practices have not been voiced at this time.    The patient's greatest support comes from his mother and this person will be involved with the treatment.    The patient has not been in an event described as horrible or outside the realm of ordinary life experience either currently or in the past.  The patient has not been a victim of sexual/physical abuse.    Section VII - Other Areas of Clinical Concern  The highest grade achieved is high school with the overall quality of school experience being described as okay.  The patient is currently disabled and speaks [place] as a primary language.  The patient has no communication impairments affecting communication. The patient's preference for learning can be described as: can read and write adequately.  The patient's hearing is normal.  The patient's vision is normal.      NORDLIE, Beyounce Dickens N, LCSW

## 2014-07-22 ENCOUNTER — Inpatient Hospital Stay
Admit: 2014-07-22 | Discharge: 2014-07-23 | Disposition: A | Discharge status: Home / Self Care | Payer: MEDICAID | Attending: Emergency Medicine

## 2014-07-22 DIAGNOSIS — L0591 Pilonidal cyst without abscess: Secondary | ICD-10-CM

## 2014-07-22 MED ORDER — CEPHALEXIN 250 MG CAP
250 mg | ORAL | Status: AC
Start: 2014-07-22 — End: 2014-07-22
  Administered 2014-07-22: 23:00:00 via ORAL

## 2014-07-22 MED ORDER — LIDOCAINE-EPINEPHRINE 1 %-1:100,000 IJ SOLN
1 %-:00,000 | Freq: Once | INTRAMUSCULAR | Status: DC
Start: 2014-07-22 — End: 2014-07-22

## 2014-07-22 MED ORDER — TRIMETHOPRIM-SULFAMETHOXAZOLE 160 MG-800 MG TAB
160-800 mg | ORAL | Status: AC
Start: 2014-07-22 — End: 2014-07-22
  Administered 2014-07-22: 23:00:00 via ORAL

## 2014-07-22 MED FILL — LIDOCAINE-EPINEPHRINE 1 %-1:100,000 IJ SOLN: 1 %-:00,000 | INTRAMUSCULAR | Qty: 20

## 2014-07-22 MED FILL — TRIMETHOPRIM-SULFAMETHOXAZOLE 160 MG-800 MG TAB: 160-800 mg | ORAL | Qty: 1

## 2014-07-22 MED FILL — CEPHALEXIN 250 MG CAP: 250 mg | ORAL | Qty: 2

## 2014-07-22 NOTE — ED Notes (Signed)
Report given to Susan W, RN

## 2014-07-22 NOTE — ED Notes (Signed)
John Duke, [place] has reviewed discharge instructions with the patient and visitor.  The patient and visitor verbalized understanding.
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
## 2014-07-22 NOTE — ED Notes (Signed)
Chaperoned exam with Barton, [place]

## 2014-07-23 MED ORDER — OXYCODONE-ACETAMINOPHEN 5 MG-325 MG TAB
5-325 mg | ORAL | Status: AC
Start: 2014-07-23 — End: 2014-07-22
  Administered 2014-07-23: via ORAL

## 2014-07-23 MED ORDER — TRIMETHOPRIM-SULFAMETHOXAZOLE 160 MG-800 MG TAB
160-800 mg | ORAL_TABLET | Freq: Two times a day (BID) | ORAL | Status: AC
Start: 2014-07-23 — End: 2014-07-29

## 2014-07-23 MED ORDER — CEPHALEXIN 500 MG CAP
500 mg | ORAL_CAPSULE | Freq: Four times a day (QID) | ORAL | Status: AC
Start: 2014-07-23 — End: 2014-07-29

## 2014-07-23 MED ORDER — HYDROCODONE-ACETAMINOPHEN 5 MG-325 MG TAB
5-325 mg | ORAL_TABLET | Freq: Four times a day (QID) | ORAL | Status: AC | PRN
Start: 2014-07-23 — End: 2014-07-25

## 2014-07-23 MED FILL — OXYCODONE-ACETAMINOPHEN 5 MG-325 MG TAB: 5-325 mg | ORAL | Qty: 1

## 2014-08-04 ENCOUNTER — Ambulatory Visit: Admit: 2014-08-04 | Discharge: 2014-08-04 | Payer: PRIVATE HEALTH INSURANCE | Attending: Surgery

## 2014-08-04 DIAGNOSIS — L0591 Pilonidal cyst without abscess: Secondary | ICD-10-CM
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
## 2014-08-18 ENCOUNTER — Inpatient Hospital Stay: Admit: 2014-08-18 | Payer: MEDICAID

## 2014-08-18 DIAGNOSIS — Z01810 Encounter for preprocedural cardiovascular examination: Secondary | ICD-10-CM

## 2014-08-18 NOTE — Other (Signed)
[facility]  Ambulatory Surgery Unit  Pre-operative Instructions    Surgery/Procedure Date  08/23/14            Tentative Arrival Time 0845      1. On the day of your surgery/procedure, please report to the Ambulatory Surgery Unit Registration Desk and sign in at your designated time. The Ambulatory Surgery Unit is located in [facility] on the [place] side of the hospital across from the [facility] [place] building. Please have all of your health insurance cards and a photo ID.    2. You must have someone with you to drive you home, as you should not drive a car for 24 hours following anesthesia. Please make arrangements for a responsible adult friend or family member to stay with you for at least the first 24 hours after your surgery.    3. Do not have anything to eat or drink (including water, gum, mints, coffee, juice) after midnight   08/22/14. This may not apply to medications prescribed by your physician.  (Please note below the special instructions with medications to take the morning of surgery, if applicable.)    4. We recommend you do not drink any alcoholic beverages for 24 hours before and after your surgery.    5. Stop all Aspirin, non-steroidal anti-inflammatory drugs (i.e. Advil, Aleve), vitamins, and supplements as directed by your surgeon's office. **If you are currently taking Plavix, Coumadin, or other blood-thinning agents, contact your surgeon for instructions.**    6. In an effort to help prevent surgical site infection, we ask that you shower with an anti-bacterial soap (i.e. Dial or Safeguard) for 3 days prior to and on the morning of surgery, using a fresh towel after each shower. (Please begin this process with fresh bed linens.) Do not apply any lotions, powders, or deodorants after the shower on the day of your procedure. If applicable, please do not shave the operative site for 48 hours prior to surgery.      7. Wear comfortable clothes. Wear glasses instead of contacts. Do not bring any jewelry or money (other than copays or fees as instructed). Do not wear make-up, particularly mascara, the morning of your surgery. Do not wear nail polish, particularly if you are having foot /hand surgery. Wear your hair loose or down, no ponytails, buns, bobby pins or clips. All body piercings must be removed.      8. You should understand that if you do not follow these instructions your surgery may be cancelled. If your physical condition changes (i.e. fever, cold or flu) please contact your surgeon as soon as possible.    9. It is important that you be on time. If a situation occurs where you may be late, or if you have any questions or problems, please call [phone].    10. Your surgery time may be subject to change. You will receive a phone call the day prior to surgery to confirm your arrival time.    11. Pediatric patients: please bring a change of clothes, diapers, bottle/sippy cup, pacifier, etc.      Special Instructions:    MEDICATIONS TO TAKE THE MORNING OF SURGERY WITH A SIP OF WATER: pain med if needed      I understand a pre-operative phone call will be made to verify my surgery time.  In the event that I am not available, I give permission for a message to be left on my answering service and/or with another person?  Yes          ___________________      ___________________      ________________  (Signature of Patient)          (Witness)                   (Date and Time)

## 2014-08-20 LAB — EKG, 12 LEAD, INITIAL
Atrial Rate: 78 {beats}/min
Calculated P Axis: 58 degrees
Calculated R Axis: 21 degrees
Calculated T Axis: 51 degrees
Diagnosis: NORMAL
P-R Interval: 134 ms
Q-T Interval: 394 ms
QRS Duration: 82 ms
QTC Calculation (Bezet): 449 ms
Ventricular Rate: 78 {beats}/min

## 2014-08-23 ENCOUNTER — Inpatient Hospital Stay: Payer: MEDICAID

## 2014-08-23 MED ORDER — LIDOCAINE (PF) 20 MG/ML (2 %) IJ SOLN
20 mg/mL (2 %) | INTRAMUSCULAR | Status: DC | PRN
Start: 2014-08-23 — End: 2014-08-23
  Administered 2014-08-23: 14:00:00 via INTRAVENOUS

## 2014-08-23 MED ORDER — SODIUM CHLORIDE 0.9 % IJ SYRG
Freq: Three times a day (TID) | INTRAMUSCULAR | Status: DC
Start: 2014-08-23 — End: 2014-08-23

## 2014-08-23 MED ORDER — ESMOLOL 10 MG/ML IV SOLN
100 mg/10 mL (10 mg/mL) | INTRAVENOUS | Status: AC
Start: 2014-08-23 — End: ?

## 2014-08-23 MED ORDER — SUCCINYLCHOLINE CHLORIDE 20 MG/ML INJECTION
20 mg/mL | INTRAMUSCULAR | Status: DC | PRN
Start: 2014-08-23 — End: 2014-08-23
  Administered 2014-08-23: 14:00:00 via INTRAVENOUS

## 2014-08-23 MED ORDER — BACITRACIN ZINC 500 UNIT/G OINTMENT
500 unit/gram | CUTANEOUS | Status: DC | PRN
Start: 2014-08-23 — End: 2014-08-23
  Administered 2014-08-23: 14:00:00 via TOPICAL

## 2014-08-23 MED ORDER — SODIUM CHLORIDE 0.9 % IJ SYRG
INTRAMUSCULAR | Status: DC | PRN
Start: 2014-08-23 — End: 2014-08-23

## 2014-08-23 MED ORDER — PROPOFOL 10 MG/ML IV EMUL
10 mg/mL | INTRAVENOUS | Status: DC | PRN
Start: 2014-08-23 — End: 2014-08-23
  Administered 2014-08-23: 14:00:00 via INTRAVENOUS

## 2014-08-23 MED ORDER — MORPHINE 10 MG/ML INJ SOLUTION
10 mg/ml | INTRAMUSCULAR | Status: DC | PRN
Start: 2014-08-23 — End: 2014-08-23

## 2014-08-23 MED ORDER — HYDROCODONE-ACETAMINOPHEN 5 MG-325 MG TAB
5-325 mg | ORAL | Status: AC
Start: 2014-08-23 — End: 2014-08-23
  Administered 2014-08-23: 15:00:00 via ORAL

## 2014-08-23 MED ORDER — ONDANSETRON (PF) 4 MG/2 ML INJECTION
4 mg/2 mL | INTRAMUSCULAR | Status: DC | PRN
Start: 2014-08-23 — End: 2014-08-23
  Administered 2014-08-23: 14:00:00 via INTRAVENOUS

## 2014-08-23 MED ORDER — HYDROCODONE-ACETAMINOPHEN 5 MG-325 MG TAB
5-325 mg | ORAL_TABLET | ORAL | Status: DC | PRN
Start: 2014-08-23 — End: 2015-03-03

## 2014-08-23 MED ORDER — FENTANYL CITRATE (PF) 50 MCG/ML IJ SOLN
50 mcg/mL | INTRAMUSCULAR | Status: DC | PRN
Start: 2014-08-23 — End: 2014-08-23
  Administered 2014-08-23 (×3): via INTRAVENOUS

## 2014-08-23 MED ORDER — METHYLENE BLUE (ANTIDOTE) 1 % IJ SOLN
1 % (0 mg/mL) | INTRAVENOUS | Status: AC
Start: 2014-08-23 — End: ?

## 2014-08-23 MED ORDER — ESMOLOL 10 MG/ML IV SOLN
100 mg/10 mL (10 mg/mL) | INTRAVENOUS | Status: DC | PRN
Start: 2014-08-23 — End: 2014-08-23
  Administered 2014-08-23: 14:00:00 via INTRAVENOUS

## 2014-08-23 MED ORDER — MEPERIDINE (PF) 25 MG/ML INJ SOLUTION
25 mg/ml | INTRAMUSCULAR | Status: DC | PRN
Start: 2014-08-23 — End: 2014-08-23

## 2014-08-23 MED ORDER — HYDROCODONE-ACETAMINOPHEN 5 MG-325 MG TAB
5-325 mg | ORAL | Status: AC
Start: 2014-08-23 — End: 2014-08-23

## 2014-08-23 MED ORDER — BACITRACIN ZINC 500 UNIT/G OINTMENT
500 unit/gram | CUTANEOUS | Status: AC
Start: 2014-08-23 — End: ?

## 2014-08-23 MED ORDER — FENTANYL CITRATE (PF) 50 MCG/ML IJ SOLN
50 mcg/mL | INTRAMUSCULAR | Status: DC | PRN
Start: 2014-08-23 — End: 2014-08-23

## 2014-08-23 MED ORDER — FENTANYL CITRATE (PF) 50 MCG/ML IJ SOLN
50 mcg/mL | INTRAMUSCULAR | Status: AC
Start: 2014-08-23 — End: ?

## 2014-08-23 MED ORDER — MIDAZOLAM 1 MG/ML IJ SOLN
1 mg/mL | INTRAMUSCULAR | Status: DC | PRN
Start: 2014-08-23 — End: 2014-08-23
  Administered 2014-08-23: 14:00:00 via INTRAVENOUS

## 2014-08-23 MED ORDER — ROCURONIUM 10 MG/ML IV
10 mg/mL | INTRAVENOUS | Status: DC | PRN
Start: 2014-08-23 — End: 2014-08-23
  Administered 2014-08-23: 14:00:00 via INTRAVENOUS

## 2014-08-23 MED ORDER — HYDROMORPHONE (PF) 1 MG/ML IJ SOLN
1 mg/mL | INTRAMUSCULAR | Status: DC | PRN
Start: 2014-08-23 — End: 2014-08-23

## 2014-08-23 MED ORDER — CEFAZOLIN 2 G IN 100 ML 0.9% NS
2 gram/100 mL | Freq: Once | INTRAVENOUS | Status: AC
Start: 2014-08-23 — End: 2014-08-23
  Administered 2014-08-23: 14:00:00 via INTRAVENOUS

## 2014-08-23 MED ORDER — CEFAZOLIN 1 GRAM SOLUTION FOR INJECTION
1 gram | INTRAMUSCULAR | Status: DC
Start: 2014-08-23 — End: 2014-08-23

## 2014-08-23 MED ORDER — MIDAZOLAM 1 MG/ML IJ SOLN
1 mg/mL | INTRAMUSCULAR | Status: AC
Start: 2014-08-23 — End: ?

## 2014-08-23 MED ORDER — DIPHENHYDRAMINE HCL 50 MG/ML IJ SOLN
50 mg/mL | INTRAMUSCULAR | Status: DC | PRN
Start: 2014-08-23 — End: 2014-08-23

## 2014-08-23 MED ORDER — LACTATED RINGERS IV
INTRAVENOUS | Status: DC
Start: 2014-08-23 — End: 2014-08-23

## 2014-08-23 MED ORDER — DOCUSATE SODIUM 100 MG CAP
100 mg | ORAL_CAPSULE | Freq: Two times a day (BID) | ORAL | Status: DC
Start: 2014-08-23 — End: 2016-03-15

## 2014-08-23 MED ORDER — ONDANSETRON (PF) 4 MG/2 ML INJECTION
4 mg/2 mL | INTRAMUSCULAR | Status: DC | PRN
Start: 2014-08-23 — End: 2014-08-23

## 2014-08-23 MED ORDER — BUPIVACAINE-EPINEPHRINE (PF) 0.25 %-1:200,000 IJ SOLN
0.25 %-1:200,000 | INTRAMUSCULAR | Status: DC | PRN
Start: 2014-08-23 — End: 2014-08-23
  Administered 2014-08-23: 14:00:00 via SUBCUTANEOUS

## 2014-08-23 MED ORDER — BUPIVACAINE-EPINEPHRINE (PF) 0.25 %-1:200,000 IJ SOLN
0.25 %-1:200,000 | INTRAMUSCULAR | Status: AC
Start: 2014-08-23 — End: ?

## 2014-08-23 MED ORDER — LACTATED RINGERS IV
INTRAVENOUS | Status: DC
Start: 2014-08-23 — End: 2014-08-23
  Administered 2014-08-23: 14:00:00 via INTRAVENOUS

## 2014-08-23 MED FILL — METHYLENE BLUE (ANTIDOTE) 1 % IJ SOLN: 1 % (0 mg/mL) | INTRAVENOUS | Qty: 1

## 2014-08-23 MED FILL — FENTANYL CITRATE (PF) 50 MCG/ML IJ SOLN: 50 mcg/mL | INTRAMUSCULAR | Qty: 5

## 2014-08-23 MED FILL — BACITRACIN ZINC 500 UNIT/G OINTMENT: 500 unit/gram | CUTANEOUS | Qty: 15

## 2014-08-23 MED FILL — MIDAZOLAM 1 MG/ML IJ SOLN: 1 mg/mL | INTRAMUSCULAR | Qty: 5

## 2014-08-23 MED FILL — LACTATED RINGERS IV: INTRAVENOUS | Qty: 1000

## 2014-08-23 MED FILL — BUPIVACAINE-EPINEPHRINE (PF) 0.25 %-1:200,000 IJ SOLN: 0.25 %-1:200,000 | INTRAMUSCULAR | Qty: 30

## 2014-08-23 MED FILL — CEFAZOLIN 2 G IN 100 ML 0.9% NS: 2 gram/100 mL | INTRAVENOUS | Qty: 100

## 2014-08-23 MED FILL — ESMOLOL 10 MG/ML IV SOLN: 100 mg/10 mL (10 mg/mL) | INTRAVENOUS | Qty: 10

## 2014-08-23 MED FILL — HYDROCODONE-ACETAMINOPHEN 5 MG-325 MG TAB: 5-325 mg | ORAL | Qty: 1

## 2014-08-23 NOTE — Op Note (Signed)
Patient ID:   Name: Carolanne GrumblingGerald Valls   Medical Record Number: [id number]   Date of Birth: 10/29/1980    Date of Surgery: 08/23/2014     Preoperative Diagnosis:   Pilonidal cyst    Postoperative Diagnosis: Same as preoperative diagnosis.    Procedures:  Pilonidal cystectomy    Surgeon: Sullivan LoneSophia D. Raul Torrance, MD     Anesthesia:  General    Estimated Blood Loss:  2 cc    Specimens:   ID Type Source Tests Collected by Time Destination   1 : PIlonidal cyst Preservative Pilonoidal Cyst  Lorriane Dehart D 12/29/2008 1018 Pathology        Indications:  Patient 34 y.o. African American male with recurrent pilonidal cyst infection presents today for pilonidal cystectomy.    Procedure Details:  The patient was seen in the Holding Room. The risks, benefits, complications, treatment options, and expected outcomes were discussed with the patient.  After informed consent was performed, patient was taken to the operating room.  After establishing general anesthesia, patient was  placed prone in the operating table.  Patient's lower back and gluteal folds were prepped and draped in standard surgical fashion.  Patient had induration with well healed scar just right of the midline in the gluteal fold.  No sinus tract was noted.   A 4 cm elliptical incision was made incorporation the scar and the indurated area.  The cyst was excised using electrocautery. Specimen was sent off to the pathology.  Wound was irrigated and good hemostasis was obtained.  Incision was then closed in two layers.  Subcutaneous layer was approximated using interrupted 2-0 Vicryl and skin was closed with interrupted 3-0 Nylon stitch.  Neosporin was applied followed by dry dressing.        Instrument, sponge, and needle counts were correct prior to closure and at the conclusion of the case.     The patient was taken to recovery room in good condition having tolerated the procedure well.

## 2014-08-23 NOTE — Anesthesia Post-Procedure Evaluation (Signed)
Post-Anesthesia Evaluation and Assessment    Patient: John Duke MRN: [id number]  SSN: [id number]    Date of Birth: 04-11-1980  Age: 34 y.o.  Sex: male       Cardiovascular Function/Vital Signs  Visit Vitals   Item Reading   ??? BP 131/94 mmHg   ??? Pulse 85   ??? Temp 36.1 ??C (97 ??F)   ??? Resp 15   ??? Ht 6' (1.829 m)   ??? Wt 107.956 kg (238 lb)   ??? BMI 32.27 kg/m2   ??? SpO2 100%       Patient is status post general anesthesia for Procedure(s):  PILONIDAL CYSTECTOMY.    Nausea/Vomiting: None    Postoperative hydration reviewed and adequate.    Pain:  Pain Scale 1: Numeric (0 - 10) (08/23/14 1102)  Pain Intensity 1: 4 (08/23/14 1102)   Managed    Neurological Status:   Neuro (WDL): Within Defined Limits (08/23/14 1102)  Neuro  Neurologic State: Alert (08/23/14 1102)   At baseline    Mental Status and Level of Consciousness: Alert and oriented     Pulmonary Status:   O2 Device: Room air (08/23/14 1102)   Adequate oxygenation and airway patent    Complications related to anesthesia: None    Post-anesthesia assessment completed. No concerns    Signed By: Maximino Sarin, MD     August 23, 2014

## 2014-08-23 NOTE — Other (Signed)
Carolanne GrumblingGerald Mchatton  10-Nov-1980  [id number]    Situation:  Verbal report given from: Donalda EwingsK. Krutko CRNA, Astrid DivineJ. Milley RN  Procedure: Procedure(s):  PILONIDAL CYSTECTOMY    Background:    Preoperative diagnosis: PILONIDAL CYST    Postoperative diagnosis: PILONIDAL CYST    Operator:  Dr. Nedra HaiLee    Assistant(s): Circ-1: Trinda PascalJames A Milley, RN  Scrub Tech-1: Felipa FurnaceGeoffrey W Vaughan  Surg Asst-1: Everrett CoombeMark Whiteaker    Specimens:   ID Type Source Tests Collected by Time Destination   1 : pilonidal cyst Preservative Buttock  Sullivan LoneSophia D Lee, MD 08/23/2014 1015 Pathology       Assessment:  Intra-procedure medications         Anesthesia gave intra-procedure sedation and medications, see anesthesia flow sheet     Intravenous fluids: LR@ KVO     Vital signs stable       Recommendation:    Permission to share finding with family or friend yes
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
## 2014-08-23 NOTE — Other (Addendum)
Pt presenting to recovery coughing up blood tinged sputum, explained to pt that he had scar tissue from history of tracheostomy and a smaller tube had to be used.  Pt does report throat is sore, will give ice chips.    1045-pt tolerating liquids without difficulty,  Pt coughing, c/o nausea, quease and green bag given.  Pt continues to spit up mucous- thick clear to whitish.  Instructed pt to sit up today since have seen sleep apnea on monitor, pt reports he has an appt. In the coming weeks to follow up on that since his wife has told him he stops breathing in his sleep.    1055- Reviewed d/c instructions with pt's wife, all questions answered.   Pt given 1 norco pill for c/o pain in buttocks area.  No more nausea reported.  Also reminded pt and wife that he needs to sit up today while sleeping since we have seen apnea on the monitor.    1102-  Small amount of pinkish liquid noted on dressing to buttocks while getting pt dressed.  Pt  Reports soreness in throat improving with apple juice and ice chips.  VSS.  No further blood tinged sputum noted at this time.    1114-Pt transported via w/c to awaiting transportation.  Reminded pt to sit on side, not directly on buttocks.

## 2014-08-23 NOTE — Interval H&P Note (Signed)
H&P Update:  John Duke was seen and examined.  History and physical has been reviewed. The patient has been examined. There have been no significant clinical changes since the completion of the originally dated History and Physical.    Signed By: Sullivan LoneSophia D Hadasa Gasner, MD     August 23, 2014 9:05 AM

## 2014-08-23 NOTE — Op Note (Signed)
Patient ID:   Name: John Duke   Medical Record Number: [id number]   Date of Birth: 09/11/1980    Date of Surgery: 08/23/2014     Preoperative Diagnosis:   Pilonidal cyst    Postoperative Diagnosis: Same as preoperative diagnosis.    Procedures:  Pilonidal cystectomy    Surgeon: Chyan Carnero D. Nashid Pellum, MD     Anesthesia:  General    Estimated Blood Loss:  2 cc    Specimens:   ID Type Source Tests Collected by Time Destination   1 : PIlonidal cyst Preservative Pilonoidal Cyst  [name] D 12/29/2008 1018 Pathology        Indications:  Patient 33 y.o. African American male with recurrent pilonidal cyst infection presents today for pilonidal cystectomy.    Procedure Details:  The patient was seen in the Holding Room. The risks, benefits, complications, treatment options, and expected outcomes were discussed with the patient.  After informed consent was performed, patient was taken to the operating room.  After establishing general anesthesia, patient was  placed prone in the operating table.  Patient's lower back and gluteal folds were prepped and draped in standard surgical fashion.  Patient had induration with well healed scar just right of the midline in the gluteal fold.  No sinus tract was noted.   A 4 cm elliptical incision was made incorporation the scar and the indurated area.  The cyst was excised using electrocautery. Specimen was sent off to the pathology.  Wound was irrigated and good hemostasis was obtained.  Incision was then closed in two layers.  Subcutaneous layer was approximated using interrupted 2-0 Vicryl and skin was closed with interrupted 3-0 Nylon stitch.  Neosporin was applied followed by dry dressing.        Instrument, sponge, and needle counts were correct prior to closure and at the conclusion of the case.     The patient was taken to recovery room in good condition having tolerated the procedure well.

## 2014-08-24 MED FILL — ONDANSETRON (PF) 4 MG/2 ML INJECTION: 4 mg/2 mL | INTRAMUSCULAR | Qty: 2

## 2014-08-24 MED FILL — LIDOCAINE (PF) 20 MG/ML (2 %) IJ SOLN: 20 mg/mL (2 %) | INTRAMUSCULAR | Qty: 5

## 2014-08-24 MED FILL — ROCURONIUM 10 MG/ML IV: 10 mg/mL | INTRAVENOUS | Qty: 5

## 2014-08-24 MED FILL — QUELICIN 20 MG/ML INJECTION SOLUTION: 20 mg/mL | INTRAMUSCULAR | Qty: 10

## 2014-08-24 MED FILL — ESMOLOL 10 MG/ML IV SOLN: 100 mg/10 mL (10 mg/mL) | INTRAVENOUS | Qty: 10

## 2014-08-24 MED FILL — DIPRIVAN 10 MG/ML INTRAVENOUS EMULSION: 10 mg/mL | INTRAVENOUS | Qty: 20

## 2014-08-25 MED ORDER — OXYCODONE-ACETAMINOPHEN 5 MG-325 MG TAB
5-325 mg | ORAL_TABLET | ORAL | Status: DC | PRN
Start: 2014-08-25 — End: 2015-03-03

## 2014-08-25 NOTE — Telephone Encounter (Signed)
surg 7-25 pionidal cyst,Need different pain med what he has is not working.Please call

## 2014-08-26 NOTE — Telephone Encounter (Signed)
Per Dr Nedra Hai, patient may pick up new Rx for pain. Given Percocet 5-325 mg 1-2 tab Q 4hr for pain #40.Patient will pick up today for front desk.

## 2014-09-06 ENCOUNTER — Ambulatory Visit: Admit: 2014-09-06 | Discharge: 2014-09-06 | Payer: PRIVATE HEALTH INSURANCE | Attending: Surgery

## 2014-09-06 DIAGNOSIS — Z09 Encounter for follow-up examination after completed treatment for conditions other than malignant neoplasm: Secondary | ICD-10-CM

## 2014-09-06 MED ORDER — OXYCODONE-ACETAMINOPHEN 5 MG-325 MG TAB
5-325 mg | ORAL_TABLET | ORAL | Status: DC | PRN
Start: 2014-09-06 — End: 2015-03-03

## 2014-09-06 NOTE — Progress Notes (Signed)
Patient is here for follow-up.  Patient underwent pilonidal cystectomy on 08/23/14.  Patient was seen in [facility] ER 2 days ago with erythema and drainage from the incision.  Patient noted to have cellulitis and was given Rx for Keflex.  Patient is still having some drainage.  No F/C/S.    PE:  BP 130/80 mmHg   Pulse 91   Resp 20   Ht 6' (1.829 m)   Wt 62.596 kg (138 lb)   BMI 18.71 kg/m2   SpO2 99%  Skin:  [employer] C/D/I.  No drainage.  No erythema or fluctuance.  Sutures in place.    Assessment:  S/p pilonidal cystectomy     Plan:  -Every other sutures removed.  -RTC in 2 weeks to have rest of the sutures removed.  -Refill on percocet #20

## 2014-09-16 ENCOUNTER — Encounter: Attending: Internal Medicine

## 2014-09-20 ENCOUNTER — Encounter: Attending: Surgery

## 2015-02-24 ENCOUNTER — Encounter: Attending: Internal Medicine

## 2015-02-25 ENCOUNTER — Encounter: Attending: Internal Medicine

## 2015-02-28 ENCOUNTER — Encounter: Attending: Internal Medicine

## 2015-03-03 ENCOUNTER — Ambulatory Visit: Admit: 2015-03-03 | Payer: PRIVATE HEALTH INSURANCE | Attending: Internal Medicine

## 2015-03-03 DIAGNOSIS — F1193 Opioid use, unspecified with withdrawal: Secondary | ICD-10-CM

## 2015-03-03 MED ORDER — ALBUTEROL SULFATE HFA 90 MCG/ACTUATION AEROSOL INHALER
90 mcg/actuation | Freq: Four times a day (QID) | RESPIRATORY_TRACT | 2 refills | Status: DC | PRN
Start: 2015-03-03 — End: 2015-07-02

## 2015-03-03 MED ORDER — BACLOFEN 10 MG TAB
10 mg | ORAL_TABLET | Freq: Three times a day (TID) | ORAL | 0 refills | Status: DC
Start: 2015-03-03 — End: 2015-03-11

## 2015-03-03 MED ORDER — PROMETHAZINE 50 MG TAB
50 mg | ORAL_TABLET | Freq: Four times a day (QID) | ORAL | 0 refills | Status: DC | PRN
Start: 2015-03-03 — End: 2015-03-11

## 2015-03-03 MED ORDER — CLONIDINE 0.1 MG TAB
0.1 mg | ORAL_TABLET | Freq: Two times a day (BID) | ORAL | 0 refills | Status: DC
Start: 2015-03-03 — End: 2015-03-11
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
## 2015-03-03 NOTE — Progress Notes (Signed)
Chief Complaint   Patient presents with   ??? Establish Care   ??? Knee Pain     Right knee pain, knee swelling     1. Have you been to the ER, urgent care clinic since your last visit?  Hospitalized since your last visit?No    2. Have you seen or consulted any other health care providers outside of the [facility] since your last visit?  Include any pap smears or colon screening. No

## 2015-03-03 NOTE — Patient Instructions (Addendum)
Learning About Heroin Use and Withdrawal  What is heroin use?  Heroin is an illegal, highly addictive drug. It's an opiate, like some types of medicines that doctors prescribe to treat pain. But while a prescribed opiate has rules for legal use, heroin does not. Heroin that is sold on the street has no quality control. The strength of each dose is not known. It is often mixed (cut) with other drugs, sugar, powdered milk, or other things. It may also be cut with poisons, such as strychnine.  Often, heroin use starts with the casual misuse of a prescribed opiate. A person may then switch to heroin because of its lower cost, in spite of the greater danger of using it.  A person who uses heroin often will start needing higher and higher doses of the drug to get the same effect. This is called tolerance. Using the drug often also means it will take more of the drug for the body to function and to feel normal. This is called physical dependence. A person who uses heroin daily can become dependent on it within a few weeks.  Taking too much heroin can be dangerous. An overdose may cause trouble breathing, low blood pressure, a low heart rate, or a coma. It's hard to know how much heroin can cause an overdose. A lot depends on how strong the drug is and what it's cut with. And if a person starts using less heroin, he or she may lose tolerance to it. The person then may be more sensitive to it and may overdose when using less heroin.  If you are worried that you or someone you know will take too much heroin, talk to your doctor about a naloxone rescue kit. Naloxone helps reverse the effect of heroin. A kit can help, and can even save your life, if you have taken too much heroin.  What happens during withdrawal?  A person who is dependent on heroin will have withdrawal symptoms within a few hours if he or she stops taking it. Symptoms can include anxiety,  nausea, sweating, and chills. The person may also have diarrhea, stomach cramps, and muscle aches. These symptoms may be mild or severe. They may feel like the flu (influenza).  Withdrawal can last from weeks to months. A person who has stopped using heroin will feel very ill for several days. A doctor may prescribe medicine to help relieve the symptoms. Cravings for heroin usually go away over the next few months. But they may suddenly come back months later.  How can a person get help?  If a person decides to stop taking heroin, it's safest to go through withdrawal under a doctor's care.  Treatment for heroin addiction may include medicine, group therapy, counseling, and drug education. The person may need to stay in a hospital or treatment center. Sometimes medicines are used to help the person take less heroin over time and then quit. Without medicine, people addicted to heroin usually go back to using it.  Treatment focuses on more than heroin. It helps the person understand why he or she started using heroin in the first place. It also helps the person cope with the emotions that may come with trying to stop using heroin. Counseling can also help the person's friends and family. It can provide support and teach them how to give the help that the person needs.  Follow-up care is a key part of your treatment and safety. Be sure to make and go to all  appointments, and call your doctor if you are having problems. It's also a good idea to know your test results and keep a list of the medicines you take.  Where can you learn more?  Go to [employer].  Enter P277 in the search box to learn more about "Learning About Heroin Use and Withdrawal."  Current as of: March 24, 2014  Content Version: 11.1  ?? 2006-2016 [employer]. Care instructions adapted under license by [employer] (which disclaims liability or warranty  for this information). If you have questions about a medical condition or this instruction, always ask your healthcare professional. [place] any warranty or liability for your use of this information.      -----------------------  [facility]   Substance Use Disorders  Rehabilitation Services  [phone]  [place] [address]

## 2015-03-11 ENCOUNTER — Ambulatory Visit: Admit: 2015-03-11 | Payer: PRIVATE HEALTH INSURANCE | Attending: Internal Medicine

## 2015-03-11 DIAGNOSIS — Z23 Encounter for immunization: Secondary | ICD-10-CM

## 2015-03-11 NOTE — Progress Notes (Signed)
Chief Complaint   Patient presents with   ??? Follow-up     labs     1. Have you been to the ER, urgent care clinic since your last visit?  Hospitalized since your last visit?No    2. Have you seen or consulted any other health care providers outside of the  Health System since your last visit?  Include any pap smears or colon screening. No

## 2015-03-11 NOTE — Patient Instructions (Addendum)
Learning About Heroin Use and Withdrawal  What is heroin use?  Heroin is an illegal, highly addictive drug. It's an opiate, like some types of medicines that doctors prescribe to treat pain. But while a prescribed opiate has rules for legal use, heroin does not. Heroin that is sold on the street has no quality control. The strength of each dose is not known. It is often mixed (cut) with other drugs, sugar, powdered milk, or other things. It may also be cut with poisons, such as strychnine.  Often, heroin use starts with the casual misuse of a prescribed opiate. A person may then switch to heroin because of its lower cost, in spite of the greater danger of using it.  A person who uses heroin often will start needing higher and higher doses of the drug to get the same effect. This is called tolerance. Using the drug often also means it will take more of the drug for the body to function and to feel normal. This is called physical dependence. A person who uses heroin daily can become dependent on it within a few weeks.  Taking too much heroin can be dangerous. An overdose may cause trouble breathing, low blood pressure, a low heart rate, or a coma. It's hard to know how much heroin can cause an overdose. A lot depends on how strong the drug is and what it's cut with. And if a person starts using less heroin, he or she may lose tolerance to it. The person then may be more sensitive to it and may overdose when using less heroin.  If you are worried that you or someone you know will take too much heroin, talk to your doctor about a naloxone rescue kit. Naloxone helps reverse the effect of heroin. A kit can help, and can even save your life, if you have taken too much heroin.  What happens during withdrawal?  A person who is dependent on heroin will have withdrawal symptoms within a few hours if he or she stops taking it. Symptoms can include anxiety,  nausea, sweating, and chills. The person may also have diarrhea, stomach cramps, and muscle aches. These symptoms may be mild or severe. They may feel like the flu (influenza).  Withdrawal can last from weeks to months. A person who has stopped using heroin will feel very ill for several days. A doctor may prescribe medicine to help relieve the symptoms. Cravings for heroin usually go away over the next few months. But they may suddenly come back months later.  How can a person get help?  If a person decides to stop taking heroin, it's safest to go through withdrawal under a doctor's care.  Treatment for heroin addiction may include medicine, group therapy, counseling, and drug education. The person may need to stay in a hospital or treatment center. Sometimes medicines are used to help the person take less heroin over time and then quit. Without medicine, people addicted to heroin usually go back to using it.  Treatment focuses on more than heroin. It helps the person understand why he or she started using heroin in the first place. It also helps the person cope with the emotions that may come with trying to stop using heroin. Counseling can also help the person's friends and family. It can provide support and teach them how to give the help that the person needs.  Follow-up care is a key part of your treatment and safety. Be sure to make and go to all  appointments, and call your doctor if you are having problems. It's also a good idea to know your test results and keep a list of the medicines you take.  Where can you learn more?  Go to [email or web address].  Enter P277 in the search box to learn more about "Learning About Heroin Use and Withdrawal."  Current as of: March 24, 2014  Content Version: 11.1  ?? 2006-2016 [employer]. Care instructions adapted under license by [employer] (which disclaims liability or warranty  for this information). If you have questions about a medical condition or this instruction, always ask your healthcare professional. [place] any warranty or liability for your use of this information.
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
## 2015-03-12 LAB — METABOLIC PANEL, BASIC
BUN/Creatinine ratio: 17 (ref 8–19)
BUN: 14 mg/dL (ref 6–20)
CO2: 23 mmol/L (ref 18–29)
Calcium: 9.9 mg/dL (ref 8.7–10.2)
Chloride: 102 mmol/L (ref 96–106)
Creatinine: 0.82 mg/dL (ref 0.76–1.27)
GFR est AA: 132 mL/min/{1.73_m2} (ref >59)
GFR est non-AA: 114 mL/min/{1.73_m2} (ref >59)
Glucose: 103 mg/dL — ABNORMAL HIGH (ref 65–99)
Potassium: 4.5 mmol/L (ref 3.5–5.2)
Sodium: 144 mmol/L (ref 134–144)

## 2015-03-12 LAB — HEMOGLOBIN A1C WITH EAG
Estimated average glucose: 103 mg/dL
Hemoglobin A1c: 5.2 % (ref 4.8–5.6)

## 2015-03-12 LAB — INTERPRETATION

## 2015-03-12 LAB — HEPATIC FUNCTION PANEL
ALT (SGPT): 27 IU/L (ref 0–44)
AST (SGOT): 21 IU/L (ref 0–40)
Albumin: 4.6 g/dL (ref 3.5–5.5)
Alk. phosphatase: 85 IU/L (ref 39–117)
Bilirubin, direct: 0.12 mg/dL (ref 0.00–0.40)
Bilirubin, total: 0.5 mg/dL (ref 0.0–1.2)
Protein, total: 7.7 g/dL (ref 6.0–8.5)

## 2015-03-12 LAB — LIPID PANEL
Cholesterol, total: 254 mg/dL — ABNORMAL HIGH (ref 100–199)
HDL Cholesterol: 41 mg/dL (ref >39)
LDL, calculated: 187 mg/dL — ABNORMAL HIGH (ref 0–99)
Triglyceride: 131 mg/dL (ref 0–149)
VLDL, calculated: 26 mg/dL (ref 5–40)

## 2015-03-12 LAB — HIV 1/2 AG/AB, 4TH GENERATION,W RFLX CONFIRM: HIV SCREEN 4TH GENERATION WRFX: NONREACTIVE

## 2015-03-12 LAB — RPR
RPR: NONREACTIVE
RPR: NONREACTIVE

## 2015-03-12 LAB — TSH AND FREE T4
T4, Free: 1.12 ng/dL (ref 0.82–1.77)
TSH: 0.895 u[IU]/mL (ref 0.450–4.500)

## 2015-03-12 LAB — HCV AB W/RFLX TO NAA
HCV AB, 144035: <0.1 s/co ratio (ref 0.0–0.9)
HCV Ab: <0.1 s/co ratio (ref 0.0–0.9)

## 2015-03-12 LAB — HIV 1/2 ANTIGEN/ANTIBODY, FOURTH GENERATION W/RFL: HIV Screen 4th Generation wRfx: NONREACTIVE

## 2015-03-13 NOTE — Addendum Note (Signed)
Addended by: Roselind Messier, Meriel Pica on: 03/13/2015 08:24 PM      Modules accepted: Orders

## 2015-03-13 NOTE — Progress Notes (Signed)
Please notify patient of labs   Thyroid function, kidney function, liver function is normal. HIV and syphilis testing are negative meaning you do not have a sexually transmitted disease.   Your cholesterol is elevated and I recommend starting you on a cholesterol lowering medication - I have prescribed a medication to your pharmacy. We will repeat this level at your follow up visit in 6 months

## 2015-03-14 MED ORDER — ATORVASTATIN 20 MG TAB
20 mg | ORAL_TABLET | Freq: Every day | ORAL | 5 refills | Status: DC
Start: 2015-03-14 — End: 2016-03-15

## 2015-03-15 NOTE — Progress Notes (Signed)
Left message for patient to return call.

## 2015-03-16 NOTE — Telephone Encounter (Signed)
Returned call to patient. See result note.

## 2015-03-16 NOTE — Progress Notes (Signed)
Patient notified. Will pick up medication and start taking.

## 2015-04-19 NOTE — Telephone Encounter (Signed)
Nila NephewJocelyn Smith [facility] wanted to inform Dr. Lennart PallFalcao, that the pt can not have any narcotics. Ezequiel EssexJocelyn can be reached at [phone].    [facility]15 [facility]

## 2015-07-02 ENCOUNTER — Encounter

## 2015-07-03 MED ORDER — VENTOLIN HFA 90 MCG/ACTUATION AEROSOL INHALER
90 mcg/actuation | RESPIRATORY_TRACT | 2 refills | Status: DC
Start: 2015-07-03 — End: 2019-02-16

## 2015-07-13 ENCOUNTER — Inpatient Hospital Stay
Admit: 2015-07-13 | Discharge: 2015-07-13 | Disposition: A | Discharge status: Home / Self Care | Payer: MEDICAID | Attending: Emergency Medicine

## 2015-07-13 DIAGNOSIS — F1910 Other psychoactive substance abuse, uncomplicated: Secondary | ICD-10-CM

## 2015-07-13 LAB — CBC WITH AUTOMATED DIFF
ABS. BASOPHILS: 0 10*3/uL (ref 0.0–0.1)
ABS. EOSINOPHILS: 0.4 10*3/uL (ref 0.0–0.4)
ABS. LYMPHOCYTES: 2.6 10*3/uL (ref 0.8–3.5)
ABS. MONOCYTES: 0.4 10*3/uL (ref 0.0–1.0)
ABS. NEUTROPHILS: 4.8 10*3/uL (ref 1.8–8.0)
BASOPHILS: 1 % (ref 0–1)
EOSINOPHILS: 5 % (ref 0–7)
HCT: 36 % — ABNORMAL LOW (ref 36.6–50.3)
HGB: 12.8 g/dL (ref 12.1–17.0)
LYMPHOCYTES: 32 % (ref 12–49)
MCH: 28.4 PG (ref 26.0–34.0)
MCHC: 35.6 g/dL (ref 30.0–36.5)
MCV: 80 FL (ref 80.0–99.0)
MONOCYTES: 5 % (ref 5–13)
NEUTROPHILS: 57 % (ref 32–75)
PLATELET: 349 10*3/uL (ref 150–400)
RBC: 4.5 M/uL (ref 4.10–5.70)
RDW: 12.6 % (ref 11.5–14.5)
WBC: 8.4 10*3/uL (ref 4.1–11.1)

## 2015-07-13 LAB — URINALYSIS W/ REFLEX CULTURE
Bacteria: NEGATIVE /hpf
Blood: NEGATIVE
Glucose: NEGATIVE mg/dL
Nitrites: NEGATIVE
Specific gravity: 1.03 (ref 1.003–1.030)
Urobilinogen: 1 EU/dL (ref 0.2–1.0)
pH (UA): 5.5 (ref 5.0–8.0)

## 2015-07-13 LAB — METABOLIC PANEL, COMPREHENSIVE
A-G Ratio: 1.1 (ref 1.1–2.2)
ALT (SGPT): 31 U/L (ref 12–78)
AST (SGOT): 21 U/L (ref 15–37)
Albumin: 3.9 g/dL (ref 3.5–5.0)
Alk. phosphatase: 95 U/L (ref 45–117)
Anion gap: NEGATIVE mmol/L (ref 5–15)
BUN/Creatinine ratio: 8 — ABNORMAL LOW (ref 12–20)
BUN: 11 MG/DL (ref 6–20)
Bilirubin, total: 0.3 MG/DL (ref 0.2–1.0)
CO2: 33 mmol/L — ABNORMAL HIGH (ref 21–32)
Calcium: 8.7 MG/DL (ref 8.5–10.1)
Chloride: 106 mmol/L (ref 97–108)
Creatinine: 1.34 MG/DL — ABNORMAL HIGH (ref 0.70–1.30)
GFR est AA: >60 mL/min/{1.73_m2} (ref >60)
GFR est non-AA: >60 mL/min/{1.73_m2} (ref >60)
Globulin: 3.7 g/dL (ref 2.0–4.0)
Glucose: 105 mg/dL — ABNORMAL HIGH (ref 65–100)
Potassium: 4.2 mmol/L (ref 3.5–5.1)
Protein, total: 7.6 g/dL (ref 6.4–8.2)
Sodium: 138 mmol/L (ref 136–145)

## 2015-07-13 LAB — DRUG SCREEN, URINE
AMPHETAMINES: NEGATIVE
BARBITURATES: NEGATIVE
BENZODIAZEPINES: POSITIVE — AB
COCAINE: POSITIVE — AB
METHADONE: NEGATIVE
OPIATES: POSITIVE — AB
PCP(PHENCYCLIDINE): NEGATIVE
THC (TH-CANNABINOL): NEGATIVE

## 2015-07-13 LAB — BILIRUBIN, CONFIRM: Bilirubin UA, confirm: NEGATIVE

## 2015-07-13 LAB — ETHYL ALCOHOL: ALCOHOL(ETHYL),SERUM: <10 MG/DL (ref <10)
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
## 2015-07-13 NOTE — ED Notes (Signed)
Per pt reports intermittent suicidal thoughts without a non specific plan "for a while," "feeling hopeless," homicidal thoughts without a plan, depression, denies visual/auditory hallucinations. Sniffed heroin today, denies alcohol use.

## 2015-07-13 NOTE — ED Notes (Signed)
Patient left before receiving discharge instructions, unable to reassess pain level.
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
## 2015-07-15 LAB — CULTURE, URINE
Colonies Counted: <1000
Colony Count: <1000
Culture result:: NO GROWTH
Culture: NO GROWTH

## 2015-09-08 ENCOUNTER — Encounter: Attending: Internal Medicine

## 2016-03-14 ENCOUNTER — Emergency Department: Payer: MEDICAID

## 2016-03-14 ENCOUNTER — Emergency Department: Admit: 2016-03-15 | Payer: MEDICAID

## 2016-03-14 DIAGNOSIS — J4541 Moderate persistent asthma with (acute) exacerbation: Secondary | ICD-10-CM

## 2016-03-14 NOTE — ED Notes (Addendum)
11:53 PM Ass[facility] care [facility]. Patient resting on stretcher, NAD noted at this time; Reports discomfort from dry cough, would like something for it. Bed low and locked, call bell in reach. Will continue to monitor.     1:49 AM Patient sleeping on stretcher; VSS. Will continue to monitor.     4:07 AM Patient reports feeling as though he's wheezing again; EKG performed. VSS. Family member provided with recliner. RT notified of treatment.

## 2016-03-14 NOTE — ED Triage Notes (Signed)
Triage note: Pt states he has a productive cough and rattling in the right lower chest.  Pt states he was seen at [facility] today and diagnosed with PNA.  Pt was discharged with Thera-Flu pills.  Pt states he was given IV antibiotics.
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
## 2016-03-14 NOTE — ED Notes (Signed)
Pt. Continues to have wheezing and rhonchi after second duo-neb. 3rd breathing treatment started.  Pt. Noted to also have maxillary and frontal sinus tenderness to light palpation

## 2016-03-15 ENCOUNTER — Inpatient Hospital Stay
Admit: 2016-03-15 | Discharge: 2016-03-15 | Discharge status: Against Medical Advice | Payer: MEDICAID | Attending: Emergency Medicine

## 2016-03-15 LAB — CBC WITH AUTOMATED DIFF
ABS. BASOPHILS: 0 10*3/uL (ref 0.0–0.1)
ABS. EOSINOPHILS: 0 10*3/uL (ref 0.0–0.4)
ABS. IMM. GRANS.: 0 10*3/uL (ref 0.00–0.04)
ABS. LYMPHOCYTES: 1.2 10*3/uL (ref 0.8–3.5)
ABS. MONOCYTES: 0.5 10*3/uL (ref 0.0–1.0)
ABS. NEUTROPHILS: 3.3 10*3/uL (ref 1.8–8.0)
ABSOLUTE NRBC: 0 10*3/uL (ref 0.00–0.01)
BASOPHILS: 0 % (ref 0–1)
EOSINOPHILS: 0 % (ref 0–7)
HCT: 32.6 % — ABNORMAL LOW (ref 36.6–50.3)
HGB: 11.4 g/dL — ABNORMAL LOW (ref 12.1–17.0)
IMMATURE GRANULOCYTES: 0 % (ref 0.0–0.5)
LYMPHOCYTES: 24 % (ref 12–49)
MCH: 28.4 PG (ref 26.0–34.0)
MCHC: 35 g/dL (ref 30.0–36.5)
MCV: 81.3 FL (ref 80.0–99.0)
MONOCYTES: 10 % (ref 5–13)
MPV: 11.4 FL (ref 8.9–12.9)
NEUTROPHILS: 65 % (ref 32–75)
NRBC: 0 PER 100 WBC
PLATELET: 179 10*3/uL (ref 150–400)
RBC: 4.01 M/uL — ABNORMAL LOW (ref 4.10–5.70)
RDW: 13 % (ref 11.5–14.5)
WBC: 5.1 10*3/uL (ref 4.1–11.1)

## 2016-03-15 LAB — METABOLIC PANEL, COMPREHENSIVE
A-G Ratio: 0.9 — ABNORMAL LOW (ref 1.1–2.2)
ALT (SGPT): 21 U/L (ref 12–78)
AST (SGOT): 22 U/L (ref 15–37)
Albumin: 3.6 g/dL (ref 3.5–5.0)
Alk. phosphatase: 72 U/L (ref 45–117)
Anion gap: 8 mmol/L (ref 5–15)
BUN/Creatinine ratio: 18 (ref 12–20)
BUN: 16 MG/DL (ref 6–20)
Bilirubin, total: 0.4 MG/DL (ref 0.2–1.0)
CO2: 27 mmol/L (ref 21–32)
Calcium: 8.5 MG/DL (ref 8.5–10.1)
Chloride: 109 mmol/L — ABNORMAL HIGH (ref 97–108)
Creatinine: 0.9 MG/DL (ref 0.70–1.30)
GFR est AA: >60 mL/min/{1.73_m2} (ref >60)
GFR est non-AA: >60 mL/min/{1.73_m2} (ref >60)
Globulin: 4.1 g/dL — ABNORMAL HIGH (ref 2.0–4.0)
Glucose: 119 mg/dL — ABNORMAL HIGH (ref 65–100)
Potassium: 3.8 mmol/L (ref 3.5–5.1)
Protein, total: 7.7 g/dL (ref 6.4–8.2)
Sodium: 144 mmol/L (ref 136–145)

## 2016-03-15 LAB — METABOLIC PANEL, BASIC
Anion gap: 7 mmol/L (ref 5–15)
BUN/Creatinine ratio: 21 — ABNORMAL HIGH (ref 12–20)
BUN: 12 MG/DL (ref 6–20)
CO2: 24 mmol/L (ref 21–32)
Calcium: 6.9 MG/DL — ABNORMAL LOW (ref 8.5–10.1)
Chloride: 114 mmol/L — ABNORMAL HIGH (ref 97–108)
Creatinine: 0.58 MG/DL — ABNORMAL LOW (ref 0.70–1.30)
GFR est AA: >60 mL/min/{1.73_m2} (ref >60)
GFR est non-AA: >60 mL/min/{1.73_m2} (ref >60)
Glucose: 107 mg/dL — ABNORMAL HIGH (ref 65–100)
Potassium: 3.9 mmol/L (ref 3.5–5.1)
Sodium: 145 mmol/L (ref 136–145)

## 2016-03-15 LAB — ECG RHYTHM ANALYSIS ADULT
Atrial Rate: 50 {beats}/min
Atrial Rate: 50 {beats}/min
Calculated P Axis: 30 degrees
Calculated R Axis: 28 degrees
Calculated T Axis: 45 degrees
P Axis: 30 degrees
P-R Interval: 148 ms
P-R Interval: 148 ms
Q-T Interval: 482 ms
Q-T Interval: 482 ms
QRS Duration: 90 ms
QRS Duration: 90 ms
QTC Calculation (Bezet): 439 ms
QTc Calculation (Bazett): 439 ms
R Axis: 28 degrees
T Axis: 45 degrees
Ventricular Rate: 50 {beats}/min
Ventricular Rate: 50 {beats}/min

## 2016-03-15 LAB — DRUG SCREEN, URINE
AMPHETAMINES: NEGATIVE
BARBITURATES: NEGATIVE
BENZODIAZEPINES: NEGATIVE
COCAINE: NEGATIVE
METHADONE: NEGATIVE
OPIATES: POSITIVE — AB
PCP(PHENCYCLIDINE): NEGATIVE
THC (TH-CANNABINOL): POSITIVE — AB

## 2016-03-15 LAB — LACTIC ACID: Lactic acid: 1.3 MMOL/L (ref 0.4–2.0)

## 2016-03-15 MED ORDER — ALBUTEROL SULFATE 0.083 % (0.83 MG/ML) SOLN FOR INHALATION
2.5 mg /3 mL (0.083 %) | RESPIRATORY_TRACT | Status: AC
Start: 2016-03-15 — End: 2016-03-14
  Administered 2016-03-15: 03:00:00 via RESPIRATORY_TRACT

## 2016-03-15 MED ORDER — ALBUTEROL SULFATE 90 MCG/ACTUATION BREATH ACTIVATED POWDER INHALER
90 mcg/actuation | Freq: Four times a day (QID) | RESPIRATORY_TRACT | 0 refills | Status: DC | PRN
Start: 2016-03-15 — End: 2018-12-31

## 2016-03-15 MED ORDER — SODIUM CHLORIDE 0.9 % IJ SYRG
INTRAMUSCULAR | Status: DC | PRN
Start: 2016-03-15 — End: 2016-03-15

## 2016-03-15 MED ORDER — ADV ADDAPTOR
500 mg | Status: DC
Start: 2016-03-15 — End: 2016-03-15
  Administered 2016-03-15: 15:00:00 via INTRAVENOUS

## 2016-03-15 MED ORDER — CEPHALEXIN 500 MG CAP
500 mg | ORAL_CAPSULE | Freq: Four times a day (QID) | ORAL | 0 refills | Status: AC
Start: 2016-03-15 — End: 2016-03-21

## 2016-03-15 MED ORDER — SODIUM CHLORIDE 0.9% BOLUS IV
0.9 % | Freq: Once | INTRAVENOUS | Status: AC
Start: 2016-03-15 — End: 2016-03-14
  Administered 2016-03-15: 04:00:00 via INTRAVENOUS

## 2016-03-15 MED ORDER — SODIUM CHLORIDE 0.9 % IV
INTRAVENOUS | Status: DC
Start: 2016-03-15 — End: 2016-03-15
  Administered 2016-03-15 (×2): via INTRAVENOUS

## 2016-03-15 MED ORDER — SODIUM CHLORIDE 0.9 % IJ SYRG
Freq: Three times a day (TID) | INTRAMUSCULAR | Status: DC
Start: 2016-03-15 — End: 2016-03-15
  Administered 2016-03-15 (×2): via INTRAVENOUS

## 2016-03-15 MED ORDER — ONDANSETRON (PF) 4 MG/2 ML INJECTION
4 mg/2 mL | Freq: Four times a day (QID) | INTRAMUSCULAR | Status: DC | PRN
Start: 2016-03-15 — End: 2016-03-15
  Administered 2016-03-15: 16:00:00 via INTRAVENOUS

## 2016-03-15 MED ORDER — ALPRAZOLAM 0.5 MG TAB
0.5 mg | Freq: Three times a day (TID) | ORAL | Status: DC | PRN
Start: 2016-03-15 — End: 2016-03-15

## 2016-03-15 MED ORDER — BUDESONIDE 0.5 MG/2 ML NEB SUSPENSION
0.5 mg/2 mL | Freq: Two times a day (BID) | RESPIRATORY_TRACT | Status: DC
Start: 2016-03-15 — End: 2016-03-15
  Administered 2016-03-15: 14:00:00 via RESPIRATORY_TRACT

## 2016-03-15 MED ORDER — BENZONATATE 100 MG CAP
100 mg | Freq: Three times a day (TID) | ORAL | Status: DC | PRN
Start: 2016-03-15 — End: 2016-03-15
  Administered 2016-03-15 (×2): via ORAL

## 2016-03-15 MED ORDER — ALBUTEROL SULFATE 0.083 % (0.83 MG/ML) SOLN FOR INHALATION
2.530.083 mg /3 mL (0.083 %) | RESPIRATORY_TRACT | Status: AC
Start: 2016-03-15 — End: 2016-03-14
  Administered 2016-03-15: 04:00:00 via RESPIRATORY_TRACT

## 2016-03-15 MED ORDER — PREDNISONE 20 MG TAB
20 mg | ORAL_TABLET | Freq: Every day | ORAL | 0 refills | Status: DC
Start: 2016-03-15 — End: 2016-08-21

## 2016-03-15 MED ORDER — GUAIFENESIN 200 MG TAB
200 mg | Freq: Three times a day (TID) | ORAL | Status: DC
Start: 2016-03-15 — End: 2016-03-15
  Administered 2016-03-15: 16:00:00 via ORAL

## 2016-03-15 MED ORDER — METHYLPREDNISOLONE (PF) 40 MG/ML IJ SOLR
40 mg/mL | Freq: Four times a day (QID) | INTRAMUSCULAR | Status: DC
Start: 2016-03-15 — End: 2016-03-15
  Administered 2016-03-15: 14:00:00 via INTRAVENOUS

## 2016-03-15 MED ORDER — METHYLPREDNISOLONE (PF) 40 MG/ML IJ SOLR
40 mg/mL | INTRAMUSCULAR | Status: AC
Start: 2016-03-15 — End: 2016-03-14
  Administered 2016-03-15: 05:00:00 via INTRAVENOUS

## 2016-03-15 MED ORDER — ADV ADDAPTOR
1 gram | Status: DC
Start: 2016-03-15 — End: 2016-03-15
  Administered 2016-03-15: 14:00:00 via INTRAVENOUS

## 2016-03-15 MED ORDER — IPRATROPIUM-ALBUTEROL 2.5 MG-0.5 MG/3 ML NEB SOLUTION
2.5-0.53 mg-0.5 mg/3 ml | RESPIRATORY_TRACT | Status: AC
Start: 2016-03-15 — End: 2016-03-14
  Administered 2016-03-15: 03:00:00 via RESPIRATORY_TRACT

## 2016-03-15 MED ORDER — ATORVASTATIN 20 MG TAB
20 mg | Freq: Every day | ORAL | Status: DC
Start: 2016-03-15 — End: 2016-03-15

## 2016-03-15 MED ORDER — METHYLPREDNISOLONE 4 MG TABS IN A DOSE PACK
4 mg | ORAL | 0 refills | Status: DC
Start: 2016-03-15 — End: 2016-03-15

## 2016-03-15 MED ORDER — SERTRALINE 50 MG TAB
50 mg | Freq: Every day | ORAL | Status: DC
Start: 2016-03-15 — End: 2016-03-15

## 2016-03-15 MED ORDER — SODIUM CHLORIDE 0.9% BOLUS IV
0.9 % | Freq: Once | INTRAVENOUS | Status: AC
Start: 2016-03-15 — End: 2016-03-15
  Administered 2016-03-15: 05:00:00 via INTRAVENOUS

## 2016-03-15 MED ORDER — ACETAMINOPHEN 325 MG TABLET
325 mg | ORAL | Status: DC | PRN
Start: 2016-03-15 — End: 2016-03-15
  Administered 2016-03-15: 16:00:00 via ORAL

## 2016-03-15 MED ORDER — IPRATROPIUM-ALBUTEROL 2.5 MG-0.5 MG/3 ML NEB SOLUTION
2.5 mg-0.5 mg/3 ml | RESPIRATORY_TRACT | Status: DC | PRN
Start: 2016-03-15 — End: 2016-03-15
  Administered 2016-03-15: 09:00:00 via RESPIRATORY_TRACT

## 2016-03-15 MED ORDER — QUETIAPINE 100 MG TAB
100 mg | Freq: Every evening | ORAL | Status: DC
Start: 2016-03-15 — End: 2016-03-15

## 2016-03-15 MED ORDER — SODIUM CHLORIDE 0.9 % IV
INTRAVENOUS | Status: DC
Start: 2016-03-15 — End: 2016-03-15

## 2016-03-15 MED ORDER — DOCUSATE SODIUM 100 MG CAP
100 mg | Freq: Two times a day (BID) | ORAL | Status: DC
Start: 2016-03-15 — End: 2016-03-15

## 2016-03-15 MED FILL — ALBUTEROL SULFATE 0.083 % (0.83 MG/ML) SOLN FOR INHALATION: 2.5 mg /3 mL (0.083 %) | RESPIRATORY_TRACT | Qty: 1

## 2016-03-15 MED FILL — SODIUM CHLORIDE 0.9 % IV: INTRAVENOUS | Qty: 1000

## 2016-03-15 MED FILL — QUETIAPINE 100 MG TAB: 100 mg | ORAL | Qty: 1

## 2016-03-15 MED FILL — LIPITOR 20 MG TABLET: 20 mg | ORAL | Qty: 1

## 2016-03-15 MED FILL — ZITHROMAX 500 MG INTRAVENOUS SOLUTION: 500 mg | INTRAVENOUS | Qty: 5

## 2016-03-15 MED FILL — SOLU-MEDROL (PF) 40 MG/ML SOLUTION FOR INJECTION: 40 mg/mL | INTRAMUSCULAR | Qty: 1

## 2016-03-15 MED FILL — ORGAN-I NR 200 MG TABLET: 200 mg | ORAL | Qty: 2

## 2016-03-15 MED FILL — DOK 100 MG CAPSULE: 100 mg | ORAL | Qty: 1

## 2016-03-15 MED FILL — MONOJECT PREFILL ADVANCED 0.9 % SODIUM CHLORIDE INJECTION SYRINGE: INTRAMUSCULAR | Qty: 10

## 2016-03-15 MED FILL — BENZONATATE 100 MG CAP: 100 mg | ORAL | Qty: 1

## 2016-03-15 MED FILL — CEFTRIAXONE 1 GRAM SOLUTION FOR INJECTION: 1 gram | INTRAMUSCULAR | Qty: 1

## 2016-03-15 MED FILL — BUDESONIDE 0.5 MG/2 ML NEB SUSPENSION: 0.5 mg/2 mL | RESPIRATORY_TRACT | Qty: 1

## 2016-03-15 MED FILL — ACETAMINOPHEN 325 MG TABLET: 325 mg | ORAL | Qty: 2

## 2016-03-15 MED FILL — SOLU-MEDROL (PF) 40 MG/ML SOLUTION FOR INJECTION: 40 mg/mL | INTRAMUSCULAR | Qty: 4

## 2016-03-15 MED FILL — ONDANSETRON (PF) 4 MG/2 ML INJECTION: 4 mg/2 mL | INTRAMUSCULAR | Qty: 2
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
## 2016-03-15 NOTE — Progress Notes (Addendum)
Patient says he and his wife need to leave AMA because [facility]. Physician at bedside explaining benefit of staying and possible consequences of leaving. Patient verbalized understanding and signed AMA paperwork.    Patient provided bus voucher X 2 and pt walked out with wife.
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
## 2016-03-15 NOTE — ED Notes (Signed)
ED EKG interpretation:  Rhythm: sinus bradycardia; and regular . Rate (approx.): 50; Axis: normal; P wave: normal; QRS interval: normal ; ST/T wave: non-specific changes;This EKG was interpreted by Lanell Personsara Amalia Edgecombe, MD,ED Provider.
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
## 2016-03-15 NOTE — Progress Notes (Signed)
I was called to see Mr John Duke, he sad he want to leave because he said his [facility]. I told him his clinical condition and the importance of inpatient treatment, he understood but discussed he insisted to leave the hospital against medical advise. I told him the risk including death but he signed against medical advice and left the hospital.

## 2016-03-16 MED FILL — SOLU-MEDROL (PF) 40 MG/ML SOLUTION FOR INJECTION: 40 mg/mL | INTRAMUSCULAR | Qty: 1

## 2016-03-19 LAB — CULTURE, BLOOD, PAIRED: Culture result:: NO GROWTH

## 2016-08-21 ENCOUNTER — Ambulatory Visit: Admit: 2016-08-21 | Discharge: 2016-08-21 | Payer: PRIVATE HEALTH INSURANCE | Attending: Internal Medicine

## 2016-08-21 DIAGNOSIS — Z0000 Encounter for general adult medical examination without abnormal findings: Secondary | ICD-10-CM

## 2016-08-21 NOTE — Patient Instructions (Addendum)
[place] [facility] [place]    [facility]   San MorelleJames Thompson M.D.  [address], [address], [place] [id number] [place]  Map  [phone]      -------------------------------------  Pia MauPeter Breslin MD   [address], [address], [place] [id number] [place]  Map  [phone]  [email or web address]

## 2016-08-21 NOTE — Progress Notes (Signed)
Reviewed record in preparation for visit and have obtained necessary documentation.    Identified pt with two pt identifiers(name and DOB).    Chief Complaint   Patient presents with   ??? Complete Physical       Health Maintenance Due   Topic Date Due   ??? Pneumococcal Vaccine (1 of 1 - PPSV23) 12/23/1997       Mr. Ladona Ridgelaylor has a reminder for a "due or due soon" health maintenance. I have asked that he discuss this further with his primary care provider for follow-up on this health maintenance.      Coordination of Care Questionnaire:  :     1) Have you been to an emergency room, urgent care clinic since your last visit? yes   Hospitalized since your last visit? no             2) Have you seen or consulted any other health care providers outside of [facility] since your last visit? no  (Include any pap smears or colon screenings in this section.)    3) In the event something were to happen to you and you were unable to speak on your behalf, do you have an Advance Directive/ Living Will in place stating your wishes? NO    Do you have an Advance Directive on file? no    4) Are you interested in receiving information on Advance Directives? NO    Patient is accompanied by self I have received verbal consent from Carolanne GrumblingGerald Weberg to discuss any/all medical information while they are present in the room.
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
## 2016-08-22 LAB — METABOLIC PANEL, COMPREHENSIVE
A-G Ratio: 1.6 (ref 1.2–2.2)
ALT (SGPT): 12 IU/L (ref 0–44)
AST (SGOT): 17 IU/L (ref 0–40)
Albumin: 4.4 g/dL (ref 3.5–5.5)
Alk. phosphatase: 84 IU/L (ref 39–117)
BUN/Creatinine ratio: 12 (ref 9–20)
BUN: 13 mg/dL (ref 6–20)
Bilirubin, total: 0.3 mg/dL (ref 0.0–1.2)
CO2: 24 mmol/L (ref 20–29)
Calcium: 9.5 mg/dL (ref 8.7–10.2)
Chloride: 103 mmol/L (ref 96–106)
Creatinine: 1.07 mg/dL (ref 0.76–1.27)
GFR est AA: 102 mL/min/{1.73_m2} (ref >59)
GFR est non-AA: 88 mL/min/{1.73_m2} (ref >59)
GLOBULIN, TOTAL: 2.7 g/dL (ref 1.5–4.5)
Glucose: 83 mg/dL (ref 65–99)
Potassium: 4.5 mmol/L (ref 3.5–5.2)
Protein, total: 7.1 g/dL (ref 6.0–8.5)
Sodium: 143 mmol/L (ref 134–144)

## 2016-08-22 LAB — CBC WITH AUTOMATED DIFF
ABS. BASOPHILS: 0 10*3/uL (ref 0.0–0.2)
ABS. EOSINOPHILS: 0.4 10*3/uL (ref 0.0–0.4)
ABS. IMM. GRANS.: 0 10*3/uL (ref 0.0–0.1)
ABS. MONOCYTES: 0.6 10*3/uL (ref 0.1–0.9)
ABS. NEUTROPHILS: 4.6 10*3/uL (ref 1.4–7.0)
Abs Lymphocytes: 1.9 10*3/uL (ref 0.7–3.1)
BASOPHILS: 1 %
EOSINOPHILS: 6 %
HCT: 40.2 % (ref 37.5–51.0)
HGB: 13.5 g/dL (ref 13.0–17.7)
IMMATURE GRANULOCYTES: 0 %
Lymphocytes: 25 %
MCH: 28 pg (ref 26.6–33.0)
MCHC: 33.6 g/dL (ref 31.5–35.7)
MCV: 83 fL (ref 79–97)
MONOCYTES: 9 %
NEUTROPHILS: 59 %
PLATELET: 305 10*3/uL (ref 150–379)
RBC: 4.82 x10E6/uL (ref 4.14–5.80)
RDW: 13.6 % (ref 12.3–15.4)
WBC: 7.6 10*3/uL (ref 3.4–10.8)

## 2016-08-22 LAB — RPR
RPR: NONREACTIVE
RPR: NONREACTIVE

## 2016-08-22 LAB — HIV 1/2 AG/AB, 4TH GENERATION,W RFLX CONFIRM: HIV SCREEN 4TH GENERATION WRFX: NONREACTIVE

## 2016-08-22 LAB — HEPATITIS C AB: Hep C Virus Ab: <0.1 s/co ratio (ref 0.0–0.9)

## 2016-08-22 LAB — HEPATITIS C ANTIBODY: HCV Ab: <0.1 s/co ratio (ref 0.0–0.9)

## 2016-08-22 LAB — HIV 1/2 ANTIGEN/ANTIBODY, FOURTH GENERATION W/RFL: HIV Screen 4th Generation wRfx: NONREACTIVE

## 2016-08-22 NOTE — Progress Notes (Signed)
Normal labs, STD testing is negative

## 2016-08-23 NOTE — Progress Notes (Signed)
Message was left for patient to contact office regarding labs

## 2016-08-23 NOTE — Progress Notes (Signed)
2 pt identifiers verified- pt informed: Notes Recorded by Raquel Roselind MessierAppa Falcao, MD on 08/22/2016 at 8:14 AM  Normal labs, STD testing is negative

## 2016-08-26 ENCOUNTER — Emergency Department

## 2016-08-26 ENCOUNTER — Emergency Department: Admit: 2016-08-27 | Payer: MEDICAID

## 2016-08-26 DIAGNOSIS — M549 Dorsalgia, unspecified: Secondary | ICD-10-CM

## 2016-08-26 NOTE — ED Triage Notes (Signed)
Arrived from home, reporting RLQ abdominal 8/10 sharp pain radiating to right lower back. Also reports NVD x2 days    Denies self medicating PTA.
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
## 2016-08-27 ENCOUNTER — Inpatient Hospital Stay
Admit: 2016-08-27 | Discharge: 2016-08-27 | Disposition: A | Discharge status: Home / Self Care | Payer: MEDICAID | Attending: Emergency Medicine

## 2016-08-27 LAB — CBC WITH AUTOMATED DIFF
ABS. BASOPHILS: 0.1 10*3/uL (ref 0.0–0.1)
ABS. EOSINOPHILS: 0.3 10*3/uL (ref 0.0–0.4)
ABS. IMM. GRANS.: 0 10*3/uL (ref 0.00–0.04)
ABS. LYMPHOCYTES: 2.3 10*3/uL (ref 0.8–3.5)
ABS. MONOCYTES: 0.7 10*3/uL (ref 0.0–1.0)
ABS. NEUTROPHILS: 5.9 10*3/uL (ref 1.8–8.0)
ABSOLUTE NRBC: 0 10*3/uL (ref 0.00–0.01)
BASOPHILS: 1 % (ref 0–1)
EOSINOPHILS: 4 % (ref 0–7)
HCT: 34.4 % — ABNORMAL LOW (ref 36.6–50.3)
HGB: 11.8 g/dL — ABNORMAL LOW (ref 12.1–17.0)
IMMATURE GRANULOCYTES: 0 % (ref 0.0–0.5)
LYMPHOCYTES: 25 % (ref 12–49)
MCH: 28.9 PG (ref 26.0–34.0)
MCHC: 34.3 g/dL (ref 30.0–36.5)
MCV: 84.3 FL (ref 80.0–99.0)
MONOCYTES: 8 % (ref 5–13)
MPV: 9.9 FL (ref 8.9–12.9)
NEUTROPHILS: 64 % (ref 32–75)
NRBC: 0 PER 100 WBC
PLATELET: 261 10*3/uL (ref 150–400)
RBC: 4.08 M/uL — ABNORMAL LOW (ref 4.10–5.70)
RDW: 12.5 % (ref 11.5–14.5)
WBC: 9.3 10*3/uL (ref 4.1–11.1)

## 2016-08-27 LAB — METABOLIC PANEL, COMPREHENSIVE
A-G Ratio: 1.1 (ref 1.1–2.2)
ALT (SGPT): 22 U/L (ref 12–78)
AST (SGOT): 19 U/L (ref 15–37)
Albumin: 3.9 g/dL (ref 3.5–5.0)
Alk. phosphatase: 88 U/L (ref 45–117)
Anion gap: 8 mmol/L (ref 5–15)
BUN/Creatinine ratio: 13 (ref 12–20)
BUN: 13 MG/DL (ref 6–20)
Bilirubin, total: 0.4 MG/DL (ref 0.2–1.0)
CO2: 30 mmol/L (ref 21–32)
Calcium: 8.8 MG/DL (ref 8.5–10.1)
Chloride: 104 mmol/L (ref 97–108)
Creatinine: 1.04 MG/DL (ref 0.70–1.30)
GFR est AA: >60 mL/min/{1.73_m2} (ref >60)
GFR est non-AA: >60 mL/min/{1.73_m2} (ref >60)
Globulin: 3.6 g/dL (ref 2.0–4.0)
Glucose: 109 mg/dL — ABNORMAL HIGH (ref 65–100)
Potassium: 3.5 mmol/L (ref 3.5–5.1)
Protein, total: 7.5 g/dL (ref 6.4–8.2)
Sodium: 142 mmol/L (ref 136–145)

## 2016-08-27 LAB — LIPASE: Lipase: 86 U/L (ref 73–393)

## 2016-08-27 MED ORDER — ONDANSETRON (PF) 4 MG/2 ML INJECTION
4 mg/2 mL | INTRAMUSCULAR | Status: AC
Start: 2016-08-27 — End: 2016-08-27
  Administered 2016-08-27: 04:00:00 via INTRAVENOUS

## 2016-08-27 MED ORDER — MORPHINE 2 MG/ML INJECTION
2 mg/mL | INTRAMUSCULAR | Status: AC
Start: 2016-08-27 — End: 2016-08-27
  Administered 2016-08-27: 04:00:00 via INTRAVENOUS

## 2016-08-27 MED ORDER — SODIUM CHLORIDE 0.9% BOLUS IV
0.9 % | Freq: Once | INTRAVENOUS | Status: AC
Start: 2016-08-27 — End: 2016-08-27
  Administered 2016-08-27: 03:00:00 via INTRAVENOUS

## 2016-08-27 MED ORDER — ONDANSETRON 4 MG TAB, RAPID DISSOLVE
4 mg | ORAL_TABLET | Freq: Three times a day (TID) | ORAL | 0 refills | Status: AC | PRN
Start: 2016-08-27 — End: 2016-09-06

## 2016-08-27 MED ORDER — LIDOCAINE 5 % (700 MG/PATCH) ADHESIVE PATCH
5 % | PACK | CUTANEOUS | 0 refills | Status: DC
Start: 2016-08-27 — End: 2019-12-15

## 2016-08-27 MED FILL — ONDANSETRON (PF) 4 MG/2 ML INJECTION: 4 mg/2 mL | INTRAMUSCULAR | Qty: 2

## 2016-08-27 MED FILL — MORPHINE 2 MG/ML INJECTION: 2 mg/mL | INTRAMUSCULAR | Qty: 3

## 2016-08-27 MED FILL — SODIUM CHLORIDE 0.9 % IV: INTRAVENOUS | Qty: 1000

## 2016-08-27 NOTE — ED Notes (Signed)
Discharge instructions reviewed by provider; pt verbalized understanding of discharge and medication use. Vitals updated, IV DC'ed and pt ambulated from department with steady gait. No apparent distress noted.

## 2016-09-27 ENCOUNTER — Emergency Department (HOSPITAL_BASED_OUTPATIENT_CLINIC_OR_DEPARTMENT_OTHER)
Admission: EM | Admit: 2016-09-27 | Discharge: 2016-09-27 | Discharge status: Against Medical Advice | Payer: Medicaid Other | Attending: Emergency Medicine | Admitting: Emergency Medicine

## 2016-09-27 ENCOUNTER — Emergency Department (HOSPITAL_BASED_OUTPATIENT_CLINIC_OR_DEPARTMENT_OTHER): Payer: Medicaid Other

## 2016-09-27 ENCOUNTER — Encounter (HOSPITAL_BASED_OUTPATIENT_CLINIC_OR_DEPARTMENT_OTHER): Payer: Self-pay | Admitting: Emergency Medicine

## 2016-09-27 DIAGNOSIS — F17200 Nicotine dependence, unspecified, uncomplicated: Secondary | ICD-10-CM | POA: Insufficient documentation

## 2016-09-27 DIAGNOSIS — J45909 Unspecified asthma, uncomplicated: Secondary | ICD-10-CM | POA: Insufficient documentation

## 2016-09-27 DIAGNOSIS — N50812 Left testicular pain: Secondary | ICD-10-CM | POA: Diagnosis present

## 2016-09-27 HISTORY — DX: Other injury of unspecified body region, initial encounter: T14.8XXA

## 2016-09-27 HISTORY — DX: Unspecified asthma, uncomplicated: J45.909

## 2016-09-27 HISTORY — DX: Accidental discharge from unspecified firearms or gun, initial encounter: W34.00XA

## 2016-09-27 MED ORDER — ONDANSETRON 4 MG PO TBDP
4.0000 mg | ORAL_TABLET | Freq: Once | ORAL | Status: AC
  Administered 2016-09-27: 4 mg via ORAL
  Filled 2016-09-27: qty 1

## 2016-09-27 MED ORDER — ACETAMINOPHEN 500 MG PO TABS
1000.0000 mg | ORAL_TABLET | Freq: Once | ORAL | Status: AC
  Administered 2016-09-27: 1000 mg via ORAL
  Filled 2016-09-27: qty 2

## 2016-09-27 NOTE — ED Notes (Signed)
Room found to be empty and gown laying on bed. Pt seen leaving the facility by registration staff.
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
## 2016-09-27 NOTE — ED Notes (Signed)
ED Provider at bedside. 

## 2016-09-27 NOTE — ED Triage Notes (Signed)
Pt reports abscess to L testicle and to buttocks.

## 2017-02-02 ENCOUNTER — Emergency Department: Admit: 2017-02-02 | Payer: MEDICAID

## 2017-02-02 ENCOUNTER — Inpatient Hospital Stay
Admit: 2017-02-02 | Discharge: 2017-02-02 | Disposition: A | Discharge status: Home / Self Care | Payer: MEDICAID | Attending: Emergency Medicine

## 2017-02-02 DIAGNOSIS — R112 Nausea with vomiting, unspecified: Secondary | ICD-10-CM

## 2017-02-02 LAB — CBC WITH AUTOMATED DIFF
ABS. BASOPHILS: 0 10*3/uL (ref 0.0–0.1)
ABS. EOSINOPHILS: 0.3 10*3/uL (ref 0.0–0.4)
ABS. IMM. GRANS.: 0 10*3/uL (ref 0.00–0.04)
ABS. LYMPHOCYTES: 1.4 10*3/uL (ref 0.8–3.5)
ABS. MONOCYTES: 0.8 10*3/uL (ref 0.0–1.0)
ABS. NEUTROPHILS: 8.1 10*3/uL — ABNORMAL HIGH (ref 1.8–8.0)
ABSOLUTE NRBC: 0 10*3/uL (ref 0.00–0.01)
BASOPHILS: 0 % (ref 0–1)
EOSINOPHILS: 2 % (ref 0–7)
HCT: 42.2 % (ref 36.6–50.3)
HGB: 14.2 g/dL (ref 12.1–17.0)
IMMATURE GRANULOCYTES: 0 % (ref 0.0–0.5)
LYMPHOCYTES: 13 % (ref 12–49)
MCH: 27.9 PG (ref 26.0–34.0)
MCHC: 33.6 g/dL (ref 30.0–36.5)
MCV: 82.9 FL (ref 80.0–99.0)
MONOCYTES: 8 % (ref 5–13)
MPV: 9.8 FL (ref 8.9–12.9)
NEUTROPHILS: 76 % — ABNORMAL HIGH (ref 32–75)
NRBC: 0 PER 100 WBC
PLATELET: 343 10*3/uL (ref 150–400)
RBC: 5.09 M/uL (ref 4.10–5.70)
RDW: 12.6 % (ref 11.5–14.5)
WBC: 10.7 10*3/uL (ref 4.1–11.1)

## 2017-02-02 LAB — URINALYSIS W/MICROSCOPIC
Bacteria: NEGATIVE /hpf
Glucose: NEGATIVE mg/dL
Nitrites: NEGATIVE
Specific gravity: 1.027 (ref 1.003–1.030)
Urobilinogen: 0.2 EU/dL (ref 0.2–1.0)
pH (UA): 6 (ref 5.0–8.0)

## 2017-02-02 LAB — METABOLIC PANEL, COMPREHENSIVE
A-G Ratio: 0.9 — ABNORMAL LOW (ref 1.1–2.2)
ALT (SGPT): 22 U/L (ref 12–78)
AST (SGOT): 14 U/L — ABNORMAL LOW (ref 15–37)
Albumin: 3.8 g/dL (ref 3.5–5.0)
Alk. phosphatase: 97 U/L (ref 45–117)
Anion gap: 6 mmol/L (ref 5–15)
BUN/Creatinine ratio: 16 (ref 12–20)
BUN: 16 MG/DL (ref 6–20)
Bilirubin, total: 0.5 MG/DL (ref 0.2–1.0)
CO2: 28 mmol/L (ref 21–32)
Calcium: 8.7 MG/DL (ref 8.5–10.1)
Chloride: 103 mmol/L (ref 97–108)
Creatinine: 1 MG/DL (ref 0.70–1.30)
GFR est AA: >60 mL/min/{1.73_m2} (ref >60)
GFR est non-AA: >60 mL/min/{1.73_m2} (ref >60)
Globulin: 4.4 g/dL — ABNORMAL HIGH (ref 2.0–4.0)
Glucose: 91 mg/dL (ref 65–100)
Potassium: 3.9 mmol/L (ref 3.5–5.1)
Protein, total: 8.2 g/dL (ref 6.4–8.2)
Sodium: 137 mmol/L (ref 136–145)

## 2017-02-02 LAB — URINE CULTURE HOLD SAMPLE

## 2017-02-02 LAB — LIPASE: Lipase: 101 U/L (ref 73–393)

## 2017-02-02 LAB — BILIRUBIN, CONFIRM: Bilirubin UA, confirm: NEGATIVE

## 2017-02-02 MED ORDER — DICYCLOMINE 10 MG CAP
10 mg | ORAL_CAPSULE | Freq: Four times a day (QID) | ORAL | 0 refills | Status: DC
Start: 2017-02-02 — End: 2018-10-02

## 2017-02-02 MED ORDER — ONDANSETRON (PF) 4 MG/2 ML INJECTION
4 mg/2 mL | INTRAMUSCULAR | Status: AC
Start: 2017-02-02 — End: 2017-02-02
  Administered 2017-02-02: 16:00:00 via INTRAVENOUS

## 2017-02-02 MED ORDER — SODIUM CHLORIDE 0.9 % INJECTION
20 mg/2 mL | INTRAMUSCULAR | Status: AC
Start: 2017-02-02 — End: 2017-02-02
  Administered 2017-02-02: 16:00:00 via INTRAVENOUS

## 2017-02-02 MED ORDER — ALBUTEROL SULFATE 0.083 % (0.83 MG/ML) SOLN FOR INHALATION
2.5 mg /3 mL (0.083 %) | RESPIRATORY_TRACT | Status: AC
Start: 2017-02-02 — End: 2017-02-02
  Administered 2017-02-02: 17:00:00 via RESPIRATORY_TRACT

## 2017-02-02 MED ORDER — DICYCLOMINE 10 MG/ML IM SOLN
10 mg/mL | INTRAMUSCULAR | Status: AC
Start: 2017-02-02 — End: 2017-02-02
  Administered 2017-02-02: 16:00:00 via INTRAMUSCULAR

## 2017-02-02 MED ORDER — SODIUM CHLORIDE 0.9% BOLUS IV
0.9 % | INTRAVENOUS | Status: AC
Start: 2017-02-02 — End: 2017-02-02
  Administered 2017-02-02: 16:00:00 via INTRAVENOUS

## 2017-02-02 MED ORDER — ONDANSETRON 4 MG TAB, RAPID DISSOLVE
4 mg | ORAL_TABLET | Freq: Three times a day (TID) | ORAL | 0 refills | Status: DC | PRN
Start: 2017-02-02 — End: 2018-10-02

## 2017-02-02 MED ORDER — DEXTROMETHORPHAN-GUAIFENESIN SR 30 MG-600 MG 12 HR TAB
600-30 mg | ORAL_TABLET | Freq: Two times a day (BID) | ORAL | 0 refills | Status: DC
Start: 2017-02-02 — End: 2018-10-02

## 2017-02-02 MED ORDER — ALBUTEROL SULFATE HFA 90 MCG/ACTUATION AEROSOL INHALER
90 mcg/actuation | RESPIRATORY_TRACT | 0 refills | Status: DC | PRN
Start: 2017-02-02 — End: 2018-10-22

## 2017-02-02 MED FILL — SODIUM CHLORIDE 0.9 % IV: INTRAVENOUS | Qty: 1000

## 2017-02-02 MED FILL — BENTYL 10 MG/ML INTRAMUSCULAR SOLUTION: 10 mg/mL | INTRAMUSCULAR | Qty: 2

## 2017-02-02 MED FILL — ALBUTEROL SULFATE 0.083 % (0.83 MG/ML) SOLN FOR INHALATION: 2.5 mg /3 mL (0.083 %) | RESPIRATORY_TRACT | Qty: 1

## 2017-02-02 MED FILL — FAMOTIDINE (PF) 20 MG/2 ML IV: 20 mg/2 mL | INTRAVENOUS | Qty: 2

## 2017-02-02 MED FILL — ONDANSETRON (PF) 4 MG/2 ML INJECTION: 4 mg/2 mL | INTRAMUSCULAR | Qty: 2

## 2017-02-02 NOTE — ED Notes (Signed)
1215: Patient ambulatory to restroom accompanied by ER RN

## 2017-02-02 NOTE — ED Triage Notes (Signed)
PT presents to the ER with complaints of vomiting, diarrhea and sweating that began yesterday. PT complains of cough, abdominal pain and chest discomfort.

## 2017-02-02 NOTE — ED Notes (Signed)
Pt given discharge instructions. Questions answered and pt states understanding, no distress noted, pt ambulated out of unit.
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
## 2017-02-14 ENCOUNTER — Inpatient Hospital Stay
Admit: 2017-02-14 | Discharge: 2017-02-14 | Disposition: A | Discharge status: Home / Self Care | Payer: MEDICAID | Attending: Emergency Medicine

## 2017-02-14 DIAGNOSIS — R079 Chest pain, unspecified: Secondary | ICD-10-CM

## 2017-02-14 LAB — SAMPLES BEING HELD

## 2017-02-14 MED ORDER — RANITIDINE 150 MG TAB
150 mg | ORAL_TABLET | Freq: Two times a day (BID) | ORAL | 0 refills | Status: AC
Start: 2017-02-14 — End: 2017-02-24
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
## 2017-02-14 NOTE — ED Notes (Signed)
Pt given discharge paperwork by MD and RN. Pt verbalized understanding. Ambulatory out of ED with steady gait.

## 2017-02-14 NOTE — ED Triage Notes (Signed)
Pt comes in via with c/c of nausea and CP since last night. Pt states he has PMH of GERD, asthma, and anxiety. Denies SOB. VSS.

## 2017-02-15 LAB — EKG, 12 LEAD, INITIAL
Atrial Rate: 79 {beats}/min
Calculated P Axis: 72 degrees
Calculated R Axis: 22 degrees
Calculated T Axis: 49 degrees
Diagnosis: NORMAL
P-R Interval: 136 ms
Q-T Interval: 394 ms
QRS Duration: 86 ms
QTC Calculation (Bezet): 451 ms
Ventricular Rate: 79 {beats}/min

## 2017-02-15 LAB — EKG 12-LEAD
Atrial Rate: 79 {beats}/min
Diagnosis: NORMAL
P Axis: 72 degrees
P-R Interval: 136 ms
Q-T Interval: 394 ms
QRS Duration: 86 ms
QTc Calculation (Bazett): 451 ms
R Axis: 22 degrees
T Axis: 49 degrees
Ventricular Rate: 79 {beats}/min

## 2017-02-19 ENCOUNTER — Inpatient Hospital Stay: Admit: 2017-02-19 | Discharge: 2017-02-19 | Payer: MEDICAID | Attending: Emergency Medicine

## 2017-02-19 NOTE — ED Notes (Signed)
Pt called for treatment room without response. Will call again.

## 2017-02-19 NOTE — ED Notes (Signed)
Pt called again for treatment room without response. Area outside ED searched with no success.

## 2017-02-19 NOTE — ED Notes (Signed)
Pt reports sob and wheezing today. Pt states he has taken his rescue inhaler 6 times without relief.

## 2017-02-21 ENCOUNTER — Encounter: Attending: Internal Medicine

## 2017-03-30 ENCOUNTER — Emergency Department (HOSPITAL_BASED_OUTPATIENT_CLINIC_OR_DEPARTMENT_OTHER): Payer: Medicaid Other

## 2017-03-30 ENCOUNTER — Encounter (HOSPITAL_BASED_OUTPATIENT_CLINIC_OR_DEPARTMENT_OTHER): Payer: Self-pay | Admitting: Emergency Medicine

## 2017-03-30 ENCOUNTER — Emergency Department (HOSPITAL_BASED_OUTPATIENT_CLINIC_OR_DEPARTMENT_OTHER)
Admission: EM | Admit: 2017-03-30 | Discharge: 2017-03-30 | Disposition: A | Discharge status: Home / Self Care | Payer: Medicaid Other | Attending: Emergency Medicine | Admitting: Emergency Medicine

## 2017-03-30 ENCOUNTER — Other Ambulatory Visit: Payer: Self-pay

## 2017-03-30 DIAGNOSIS — R0602 Shortness of breath: Secondary | ICD-10-CM | POA: Diagnosis present

## 2017-03-30 DIAGNOSIS — F17210 Nicotine dependence, cigarettes, uncomplicated: Secondary | ICD-10-CM | POA: Insufficient documentation

## 2017-03-30 DIAGNOSIS — Z79899 Other long term (current) drug therapy: Secondary | ICD-10-CM | POA: Insufficient documentation

## 2017-03-30 DIAGNOSIS — J4541 Moderate persistent asthma with (acute) exacerbation: Secondary | ICD-10-CM | POA: Insufficient documentation

## 2017-03-30 HISTORY — DX: Opioid abuse, uncomplicated: F11.10

## 2017-03-30 HISTORY — DX: Bipolar disorder, unspecified: F31.9

## 2017-03-30 MED ORDER — IPRATROPIUM-ALBUTEROL 0.5-2.5 (3) MG/3ML IN SOLN
3.0000 mL | Freq: Four times a day (QID) | RESPIRATORY_TRACT | Status: DC
  Administered 2017-03-30: 3 mL via RESPIRATORY_TRACT
  Filled 2017-03-30: qty 3

## 2017-03-30 MED ORDER — ALBUTEROL SULFATE (2.5 MG/3ML) 0.083% IN NEBU: 2.5000 mg | INHALATION_SOLUTION | Freq: Four times a day (QID) | RESPIRATORY_TRACT | 12 refills | Status: AC | PRN

## 2017-03-30 MED ORDER — ALBUTEROL SULFATE HFA 108 (90 BASE) MCG/ACT IN AERS: 2.0000 | INHALATION_SPRAY | RESPIRATORY_TRACT | 0 refills | Status: AC | PRN

## 2017-03-30 MED ORDER — ALBUTEROL SULFATE (2.5 MG/3ML) 0.083% IN NEBU
2.5000 mg | INHALATION_SOLUTION | Freq: Once | RESPIRATORY_TRACT | Status: AC
  Administered 2017-03-30: 2.5 mg via RESPIRATORY_TRACT
  Filled 2017-03-30: qty 3

## 2017-03-30 MED ORDER — PREDNISONE 20 MG PO TABS: 60.0000 mg | ORAL_TABLET | Freq: Every day | ORAL | 0 refills | Status: AC

## 2017-03-30 MED ORDER — PREDNISONE 50 MG PO TABS
60.0000 mg | ORAL_TABLET | Freq: Once | ORAL | Status: AC
  Administered 2017-03-30: 60 mg via ORAL
  Filled 2017-03-30: qty 1

## 2017-03-30 NOTE — Discharge Instructions (Signed)
To find a primary care or specialty doctor please call [phone] or [phone] to access "[facility] Find a Doctor Service." ° °You may also go on the [facility] website at [email or web address] ° °There are also multiple [facility], [facility], [facility] and [facility] practices throughout the [place] that are frequently accepting new patients. You may find a clinic that is close to your home and contact them. ° °[facility] -  °[address] °[place] [address] °[phone] ° ° °[facility] -  °[address] °[place] [address] °[phone] ° ° °[facility] - °[address]  °[place] [address] °[phone] ° ° °
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
## 2017-03-30 NOTE — ED Triage Notes (Signed)
Pt with symptoms of URI including cough. Pt reports increased shob. Pt has hx of asthma.

## 2017-03-30 NOTE — ED Notes (Signed)
Given orange juice

## 2017-03-30 NOTE — ED Notes (Signed)
Patient transported to X-ray 
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
## 2018-02-20 NOTE — Telephone Encounter (Signed)
Spoke with patient.  Two pt identifiers confirmed.   Patient offered an appointment with Dr. Appa on 03/04/2018.  Appointment accepted.  Patient advised if anything changes or if unable to keep this appointment to call the office  Pt verbalized understanding of information discussed w/ no further questions at this time.

## 2018-03-04 ENCOUNTER — Encounter: Attending: Internal Medicine | Primary: Internal Medicine

## 2018-08-18 MED ORDER — AMLODIPINE 10 MG TAB
10 mg | ORAL_TABLET | Freq: Every day | ORAL | 1 refills | Status: DC
Start: 2018-08-18 — End: 2018-10-02

## 2018-08-18 MED ORDER — HYDROCHLOROTHIAZIDE 12.5 MG TAB
12.5 mg | ORAL_TABLET | Freq: Every day | ORAL | 1 refills | Status: DC
Start: 2018-08-18 — End: 2018-10-02

## 2018-08-18 NOTE — Telephone Encounter (Signed)
#  [phone] Luanna Cole states pt needs an appt and would like a CPE as soon as possible.  Pt needs refills and an appt.  Pt has not been seen since 2018.      Luanna Cole states pt needs amlodipine 10 mg and Hctz 12.5 mg    Neither of these are on list or in history.    Please call pertaining to all.

## 2018-08-18 NOTE — Telephone Encounter (Signed)
Spoke with patient.  Two pt identifiers confirmed.   Patient offered an appointment with Dr. Vida Rigger on 10/02/2018.  Appointment accepted.  Patient advised if anything changes or if unable to keep this appointment to call the office  Pt verbalized understanding of information discussed w/ no further questions at this time.     Patient advised that we can refill his medication and will give him enough to las until his appointment.  Pt verbalized understanding of information discussed w/ no further questions at this time.

## 2018-08-27 ENCOUNTER — Inpatient Hospital Stay
Admit: 2018-08-27 | Discharge: 2018-08-28 | Disposition: A | Discharge status: Home / Self Care | Payer: PRIVATE HEALTH INSURANCE | Attending: Emergency Medicine

## 2018-08-27 ENCOUNTER — Emergency Department: Admit: 2018-08-28 | Payer: PRIVATE HEALTH INSURANCE | Primary: Internal Medicine

## 2018-08-27 DIAGNOSIS — R0789 Other chest pain: Secondary | ICD-10-CM

## 2018-08-27 LAB — CBC WITH AUTOMATED DIFF
ABS. BASOPHILS: 0.1 10*3/uL (ref 0.0–0.1)
ABS. EOSINOPHILS: 0.1 10*3/uL (ref 0.0–0.4)
ABS. IMM. GRANS.: 0.1 10*3/uL — ABNORMAL HIGH (ref 0.00–0.04)
ABS. LYMPHOCYTES: 3.5 10*3/uL (ref 0.8–3.5)
ABS. MONOCYTES: 0.8 10*3/uL (ref 0.0–1.0)
ABS. NEUTROPHILS: 11.6 10*3/uL — ABNORMAL HIGH (ref 1.8–8.0)
ABSOLUTE NRBC: 0 10*3/uL (ref 0.00–0.01)
BASOPHILS: 0 % (ref 0–1)
EOSINOPHILS: 1 % (ref 0–7)
HCT: 40.2 % (ref 36.6–50.3)
HGB: 13.7 g/dL (ref 12.1–17.0)
IMMATURE GRANULOCYTES: 0 % (ref 0.0–0.5)
LYMPHOCYTES: 22 % (ref 12–49)
MCH: 27.9 PG (ref 26.0–34.0)
MCHC: 34.1 g/dL (ref 30.0–36.5)
MCV: 81.9 FL (ref 80.0–99.0)
MONOCYTES: 5 % (ref 5–13)
MPV: 9.9 FL (ref 8.9–12.9)
NEUTROPHILS: 72 % (ref 32–75)
NRBC: 0 PER 100 WBC
PLATELET: 329 10*3/uL (ref 150–400)
RBC: 4.91 M/uL (ref 4.10–5.70)
RDW: 12.8 % (ref 11.5–14.5)
WBC: 16 10*3/uL — ABNORMAL HIGH (ref 4.1–11.1)

## 2018-08-27 LAB — METABOLIC PANEL, COMPREHENSIVE
A-G Ratio: 0.9 — ABNORMAL LOW (ref 1.1–2.2)
ALT (SGPT): 34 U/L (ref 12–78)
AST (SGOT): 15 U/L (ref 15–37)
Albumin: 4 g/dL (ref 3.5–5.0)
Alk. phosphatase: 99 U/L (ref 45–117)
Anion gap: 6 mmol/L (ref 5–15)
BUN/Creatinine ratio: 19 (ref 12–20)
BUN: 21 MG/DL — ABNORMAL HIGH (ref 6–20)
Bilirubin, total: 0.4 MG/DL (ref 0.2–1.0)
CO2: 29 mmol/L (ref 21–32)
Calcium: 9.1 MG/DL (ref 8.5–10.1)
Chloride: 103 mmol/L (ref 97–108)
Creatinine: 1.1 MG/DL (ref 0.70–1.30)
GFR est AA: >60 mL/min/{1.73_m2} (ref >60)
GFR est non-AA: >60 mL/min/{1.73_m2} (ref >60)
Globulin: 4.5 g/dL — ABNORMAL HIGH (ref 2.0–4.0)
Glucose: 140 mg/dL — ABNORMAL HIGH (ref 65–100)
Potassium: 3.5 mmol/L (ref 3.5–5.1)
Protein, total: 8.5 g/dL — ABNORMAL HIGH (ref 6.4–8.2)
Sodium: 138 mmol/L (ref 136–145)

## 2018-08-27 LAB — SAMPLES BEING HELD

## 2018-08-27 LAB — TROPONIN I: Troponin-I, Qt.: <0.05 ng/mL (ref <0.05)

## 2018-08-27 LAB — CBC WITH AUTO DIFFERENTIAL
Basophils %: 0 % (ref 0–1)
Basophils Absolute: 0.1 10*3/uL (ref 0.0–0.1)
Eosinophils %: 1 % (ref 0–7)
Eosinophils Absolute: 0.1 10*3/uL (ref 0.0–0.4)
Granulocyte Absolute Count: 0.1 10*3/uL — ABNORMAL HIGH (ref 0.00–0.04)
Hematocrit: 40.2 % (ref 36.6–50.3)
Hemoglobin: 13.7 g/dL (ref 12.1–17.0)
Immature Granulocytes: 0 % (ref 0.0–0.5)
Lymphocytes %: 22 % (ref 12–49)
Lymphocytes Absolute: 3.5 10*3/uL (ref 0.8–3.5)
MCH: 27.9 PG (ref 26.0–34.0)
MCHC: 34.1 g/dL (ref 30.0–36.5)
MCV: 81.9 FL (ref 80.0–99.0)
MPV: 9.9 FL (ref 8.9–12.9)
Monocytes %: 5 % (ref 5–13)
Monocytes Absolute: 0.8 10*3/uL (ref 0.0–1.0)
NRBC Absolute: 0 10*3/uL (ref 0.00–0.01)
Neutrophils %: 72 % (ref 32–75)
Neutrophils Absolute: 11.6 10*3/uL — ABNORMAL HIGH (ref 1.8–8.0)
Nucleated RBCs: 0 PER 100 WBC
Platelets: 329 10*3/uL (ref 150–400)
RBC: 4.91 M/uL (ref 4.10–5.70)
RDW: 12.8 % (ref 11.5–14.5)
WBC: 16 10*3/uL — ABNORMAL HIGH (ref 4.1–11.1)

## 2018-08-27 LAB — COMPREHENSIVE METABOLIC PANEL
ALT: 34 U/L (ref 12–78)
AST: 15 U/L (ref 15–37)
Albumin/Globulin Ratio: 0.9 — ABNORMAL LOW (ref 1.1–2.2)
Albumin: 4 g/dL (ref 3.5–5.0)
Alkaline Phosphatase: 99 U/L (ref 45–117)
Anion Gap: 6 mmol/L (ref 5–15)
BUN: 21 MG/DL — ABNORMAL HIGH (ref 6–20)
Bun/Cre Ratio: 19 (ref 12–20)
CO2: 29 mmol/L (ref 21–32)
Calcium: 9.1 MG/DL (ref 8.5–10.1)
Chloride: 103 mmol/L (ref 97–108)
Creatinine: 1.1 MG/DL (ref 0.70–1.30)
EGFR IF NonAfrican American: >60 mL/min/{1.73_m2} (ref >60)
GFR African American: >60 mL/min/{1.73_m2} (ref >60)
Globulin: 4.5 g/dL — ABNORMAL HIGH (ref 2.0–4.0)
Glucose: 140 mg/dL — ABNORMAL HIGH (ref 65–100)
Potassium: 3.5 mmol/L (ref 3.5–5.1)
Sodium: 138 mmol/L (ref 136–145)
Total Bilirubin: 0.4 MG/DL (ref 0.2–1.0)
Total Protein: 8.5 g/dL — ABNORMAL HIGH (ref 6.4–8.2)

## 2018-08-27 LAB — TROPONIN: Troponin I: <0.05 ng/mL (ref <0.05)

## 2018-08-27 NOTE — ED Notes (Signed)
Triage Note: Patient is coming in for intermittent chest pain since starting therapy for rotator cuff to the left shoulder. Patient states worried about pneumonia
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
## 2018-08-27 NOTE — ED Notes (Signed)
Discharge instructions given to pt by RN in teach back method and verbalizes understanding. Opportunity for questions provided. Pt ambulatory out of unit, in no acute distress and taken home by male companion
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
## 2018-08-27 NOTE — ED Triage Notes (Signed)
Triage Note: Patient is coming in for intermittent chest pain since starting therapy for rotator cuff to the left shoulder. Patient states worried about pneumonia

## 2018-08-27 NOTE — ED Notes (Signed)
Discharge instructions given to pt by RN in teach back method and verbalizes understanding. Opportunity for questions provided. Pt ambulatory out of unit, in no acute distress and taken home by male companion

## 2018-08-28 LAB — EKG, 12 LEAD, INITIAL
Atrial Rate: 84 {beats}/min
Calculated P Axis: 72 degrees
Calculated R Axis: 24 degrees
Calculated T Axis: 18 degrees
Diagnosis: NORMAL
P-R Interval: 134 ms
Q-T Interval: 364 ms
QRS Duration: 82 ms
QTC Calculation (Bezet): 430 ms
Ventricular Rate: 84 {beats}/min

## 2018-08-28 LAB — D DIMER: D-dimer: 0.37 mg/L FEU (ref 0.00–0.65)

## 2018-08-28 LAB — EKG 12-LEAD
Atrial Rate: 84 {beats}/min
Diagnosis: NORMAL
P Axis: 72 degrees
P-R Interval: 134 ms
Q-T Interval: 364 ms
QRS Duration: 82 ms
QTc Calculation (Bazett): 430 ms
R Axis: 24 degrees
T Axis: 18 degrees
Ventricular Rate: 84 {beats}/min

## 2018-08-28 LAB — D-DIMER, QUANTITATIVE: D-Dimer, Quant: 0.37 mg/L FEU (ref 0.00–0.65)

## 2018-08-28 NOTE — Progress Notes (Signed)
 Patient contacted regarding COVID-19  risk. Discussed COVID-19 related testing which was not done at this time. Test results were not done. Patient informed of results, if available? n/a     Care Transition Nurse/ Ambulatory Care Manager contacted the patient by telephone to perform post discharge assessment. Verified name and DOB with patient as identifiers. Provided introduction to self, and explanation of the CTN/ACM role, and reason for call due to risk factors for infection and/or exposure to COVID-19.     Symptoms reviewed with patient who verbalized the following symptoms: no new symptoms and no worsening symptoms.      Due to no new or worsening symptoms encounter was not routed to provider for escalation. Discussed follow-up appointments. If no appointment was previously scheduled, appointment scheduling offered: yes  [facility] follow up appointment(s):   Future Appointments   Date Time Provider Department Center   10/02/2018  2:00 PM Appa Falcao, Raquel, MD [facility] BS AMB     Non-[facility] follow up appointment(s): n/a      Advance Care Planning:   Does patient have an Advance Directive: patient declined education     Patient has following risk factors of: no known risk factors. CTN/ACM reviewed discharge instructions, medical action plan and red flags such as increased shortness of breath, increasing fever and signs of decompensation with patient who verbalized understanding.   Discussed exposure protocols and quarantine with CDC Guidelines "What to do if you are sick with coronavirus disease 2019." Patient was given an opportunity for questions and concerns. The patient agrees to contact the [facility] exposure line [phone], local health department [place]  Department of Health  [phone]) and PCP office for questions related to their healthcare. CTN/ACM provided contact information for future needs.    Reviewed and educated patient on any new and changed medications related to discharge  diagnosis.    Patient/family/caregiver given information for [employer] and agrees to enroll no  Patient's preferred e-mail:  n/a  Patient's preferred phone number: n/a  Based on Loop alert triggers, patient will be contacted by nurse care manager for worsening symptoms.    Plan for follow up call in 14 days based on severity of symptoms and risk factors.

## 2018-08-28 NOTE — Progress Notes (Signed)
Patient contacted regarding COVID-19  risk. Discussed COVID-19 related testing which was not done at this time. Test results were not done. Patient informed of results, if available? n/a     Care Transition Nurse/ Ambulatory Care Manager contacted the patient by telephone to perform post discharge assessment. Verified name and DOB with patient as identifiers. Provided introduction to self, and explanation of the CTN/ACM role, and reason for call due to risk factors for infection and/or exposure to COVID-19.     Symptoms reviewed with patient who verbalized the following symptoms: no new symptoms and no worsening symptoms.      Due to no new or worsening symptoms encounter was not routed to provider for escalation. Discussed follow-up appointments. If no appointment was previously scheduled, appointment scheduling offered: yes  [place] follow up appointment(s):   Future Appointments   Date Time Provider [place]   10/02/2018  2:00 PM Appa Wonda Olds, MD [facility] BS AMB     Non-[facility] follow up appointment(s): n/a      Advance Care Planning:   Does patient have an Advance Directive: patient declined education     Patient has following risk factors of: no known risk factors. CTN/ACM reviewed discharge instructions, medical action plan and red flags such as increased shortness of breath, increasing fever and signs of decompensation with patient who verbalized understanding.   Discussed exposure protocols and quarantine with CDC Guidelines ???What to do if you are sick with coronavirus disease 2019.??? Patient was given an opportunity for questions and concerns. The patient agrees to contact the [facility] exposure line [phone], local health department [place] Department of Health  [phone]) and PCP office for questions related to their healthcare. CTN/ACM provided contact information for future needs.    Reviewed and educated patient on any new and changed medications related to discharge diagnosis.     Patient/family/caregiver given information for [employer] and agrees to enroll no  Patient's preferred e-mail:  n/a  Patient's preferred phone number: n/a  Based on Loop alert triggers, patient will be contacted by nurse care manager for worsening symptoms.    Plan for follow up call in 14 days based on severity of symptoms and risk factors.

## 2018-09-11 NOTE — Progress Notes (Signed)
Patient resolved from Transition of Care episode on 09/11/18.    Discussed COVID-19 related testing which was not done at this time. Test results were not done. Patient informed of results, if available? n/a     Patient/family has been provided the following resources and education related to COVID-19:                         Signs, symptoms and red flags related to COVID-19            CDC exposure and quarantine guidelines            [facility] exposure contact - [phone]            Contact for their local Department of Health               Patient currently reports that the following symptoms have improved:  no new symptoms and no worsening symptoms.    No further outreach scheduled with this CTN/ACM/LPN/HC/ MA.  Episode of Care resolved.  Patient has this CTN/ACM/LPN/HC/MA contact information if future needs arise.

## 2018-09-11 NOTE — Progress Notes (Signed)
Patient resolved from Transition of Care episode on 09/11/18.    Discussed COVID-19 related testing which was not done at this time. Test results were not done. Patient informed of results, if available? n/a     Patient/family has been provided the following resources and education related to COVID-19:                         Signs, symptoms and red flags related to COVID-19            CDC exposure and quarantine guidelines            [facility] exposure contact - [phone]            Contact for their local Department of Health               Patient currently reports that the following symptoms have improved:  no new symptoms and no worsening symptoms.    No further outreach scheduled with this CTN/ACM/LPN/HC/ MA.  Episode of Care resolved.  Patient has this CTN/ACM/LPN/HC/MA contact information if future needs arise.

## 2018-10-02 ENCOUNTER — Telehealth: Attending: Internal Medicine | Primary: Internal Medicine

## 2018-10-02 ENCOUNTER — Telehealth: Admit: 2018-10-02 | Payer: Self-pay | Attending: Internal Medicine | Primary: Internal Medicine

## 2018-10-02 DIAGNOSIS — F1121 Opioid dependence, in remission: Secondary | ICD-10-CM

## 2018-10-02 MED ORDER — MIRTAZAPINE 30 MG TAB
30 mg | ORAL_TABLET | Freq: Every evening | ORAL | 1 refills | Status: DC
Start: 2018-10-02 — End: 2019-02-16

## 2018-10-02 MED ORDER — PNEUMOCOCCAL 23-VALPS VACCINE 25 MCG/0.5 ML INJECTION
25 mcg/0.5 mL | Freq: Once | INTRAMUSCULAR | 0 refills | Status: AC
Start: 2018-10-02 — End: 2018-10-02

## 2018-10-02 MED ORDER — HYDROCHLOROTHIAZIDE 12.5 MG TAB
12.5 mg | ORAL_TABLET | Freq: Every day | ORAL | 1 refills | Status: DC
Start: 2018-10-02 — End: 2019-02-16

## 2018-10-02 MED ORDER — AMLODIPINE 10 MG TAB
10 mg | ORAL_TABLET | Freq: Every day | ORAL | 1 refills | Status: DC
Start: 2018-10-02 — End: 2019-02-16
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
## 2018-10-02 NOTE — Progress Notes (Signed)
No diabetes     Cholesterol is high - Cholesterol: Triglycerides are high ( short term fat storage), HDL good cholesterol is low,  LDL which is bad cholesterol is mildly elevated. Recommend increasing  exercise to 30 minutes daily and increased fiber intake - vegetables, fruits and oats and whole grain.  Decreasing fatty food intake. We will repeat cholesterol level in one year.
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
## 2018-10-02 NOTE — Progress Notes (Signed)
No diabetes     Cholesterol is high - Cholesterol: Triglycerides are high ( short term fat storage), HDL good cholesterol is low,  LDL which is bad cholesterol is mildly elevated. Recommend increasing  exercise to 30 minutes daily and increased fiber intake - vegetables, fruits and oats and whole grain.  Decreasing fatty food intake. We will repeat cholesterol level in one year.

## 2018-10-11 LAB — LIPID PANEL
Cholesterol, Total: 227 mg/dL — ABNORMAL HIGH (ref 100–199)
Cholesterol, total: 227 mg/dL — ABNORMAL HIGH (ref 100–199)
HDL Cholesterol: 34 mg/dL — ABNORMAL LOW (ref >39)
HDL: 34 mg/dL — ABNORMAL LOW (ref >39)
LDL Calculated: 124 mg/dL — ABNORMAL HIGH (ref 0–99)
LDL, calculated: 124 mg/dL — ABNORMAL HIGH (ref 0–99)
Triglyceride: 388 mg/dL — ABNORMAL HIGH (ref 0–149)
Triglycerides: 388 mg/dL — ABNORMAL HIGH (ref 0–149)
VLDL, calculated: 69 mg/dL — ABNORMAL HIGH (ref 5–40)
VLDL: 69 mg/dL — ABNORMAL HIGH (ref 5–40)

## 2018-10-11 LAB — HEMOGLOBIN A1C W/O EAG
Hemoglobin A1C: 5 % (ref 4.8–5.6)
Hemoglobin A1c: 5 % (ref 4.8–5.6)

## 2018-10-22 MED ORDER — ALBUTEROL SULFATE HFA 90 MCG/ACTUATION AEROSOL INHALER
90 mcg/actuation | RESPIRATORY_TRACT | 0 refills | Status: DC | PRN
Start: 2018-10-22 — End: 2018-11-11

## 2018-10-22 NOTE — Telephone Encounter (Signed)
-----   Message from Lamar Sprinkles sent at 10/21/2018  6:32 PM EDT -----  Regarding: Dr. Eben Burow  Caller (if not patient): Pt  Relationship of caller (if not patient): Pt  Best contact number(s): [phone]  Name of medication and dosage if known: Albuterol asthma pump  Is patient out of this medication (yes/no): no. Has 20 more pumps left  Ph[facility]  Pharmacy listed in chart? (yes/no): yes  Pharmacy phone number: [phone]  Date of last visit: 10/02/18  Details to clarify the request: Would also like to know if he needs to get the pneumonia 1 or 2 shot at [facility].

## 2018-11-11 MED ORDER — ALBUTEROL SULFATE HFA 90 MCG/ACTUATION AEROSOL INHALER
90 mcg/actuation | RESPIRATORY_TRACT | 2 refills | Status: DC | PRN
Start: 2018-11-11 — End: 2019-02-16

## 2018-11-11 NOTE — Telephone Encounter (Signed)
Strong, Tina  P [employer]       ??       Medication Refill     Caller (if not patient): self   Relationship of caller (if not patient): self   Best contact number(s): [phone]   Name of medication and dosage if known: Albuterol   Is patient out of this medication (yes/no): Yes   [facility]   Pharmacy listed in chart? (yes/no): Yes   Pharmacy phone number: [phone]   Date of last visit: 10/02/18   Details to clarify the request: Pt is requesting prescription be submitted with refills if possible.     Copy/Paste  ENVERA

## 2018-12-31 MED ORDER — ALBUTEROL SULFATE 90 MCG/ACTUATION BREATH ACTIVATED POWDER INHALER
90 mcg/actuation | Freq: Four times a day (QID) | RESPIRATORY_TRACT | 0 refills | Status: DC | PRN
Start: 2018-12-31 — End: 2019-06-15

## 2018-12-31 NOTE — Telephone Encounter (Signed)
Medication Refill     Caller (if not patient):n/a       Relationship of caller (if not patient):n/a       Best contact number(s):[phone]       Name of medication and dosage if known: Albuterol       Is patient out of this medication (yes/no) no, 20 puffs left       [facility]     Pharmacy listed in chart? (yes/no): yes   Pharmacy phone number:n/a       Details to clarify the request: Pt is using this inhaler more often due to the weather. He has no refills left and would like to renew it. The pt is also requesting something for his GERD he is having severe reflux.       Tracie Harrier       ??

## 2019-01-05 ENCOUNTER — Inpatient Hospital Stay
Admit: 2019-01-05 | Discharge: 2019-01-06 | Disposition: A | Discharge status: Home / Self Care | Payer: Self-pay | Attending: Emergency Medicine

## 2019-01-05 DIAGNOSIS — R1310 Dysphagia, unspecified: Secondary | ICD-10-CM

## 2019-01-05 NOTE — ED Notes (Signed)
Discharge instructions given to patient by Provider. Pt had been given counseling on medication use and verbalizes understanding by provider. Pt ambulated off unit in no signs of distress.

## 2019-01-05 NOTE — ED Notes (Signed)
Triage:  Pt to the ED due to concerns over something stuck in his esophagus. Pt states history of reflux, Pt states this evening he was eating dinner when he burped and felt like something is stuck in his upper esophagus. Pt states difficulty with swallowing liquids and solids.

## 2019-01-05 NOTE — ED Notes (Signed)
Discharge instructions given to patient by Provider. Pt had been given counseling on medication use and verbalizes understanding by provider. Pt ambulated off unit in no signs of distress.

## 2019-01-05 NOTE — ED Triage Notes (Signed)
Triage:  Pt to the ED due to concerns over something stuck in his esophagus. Pt states history of reflux, Pt states this evening he was eating dinner when he burped and felt like something is stuck in his upper esophagus. Pt states difficulty with swallowing liquids and solids.

## 2019-02-02 ENCOUNTER — Ambulatory Visit: Payer: Self-pay | Attending: Internal Medicine | Primary: Internal Medicine

## 2019-02-16 ENCOUNTER — Telehealth: Attending: Internal Medicine | Primary: Internal Medicine

## 2019-02-16 ENCOUNTER — Telehealth: Admit: 2019-02-16 | Payer: MEDICAID | Attending: Internal Medicine | Primary: Internal Medicine

## 2019-02-16 DIAGNOSIS — R1115 Cyclical vomiting syndrome unrelated to migraine: Secondary | ICD-10-CM

## 2019-02-16 MED ORDER — HYDROCHLOROTHIAZIDE 12.5 MG TAB
12.5 mg | ORAL_TABLET | Freq: Every day | ORAL | 3 refills | Status: DC
Start: 2019-02-16 — End: 2019-06-15

## 2019-02-16 MED ORDER — PANTOPRAZOLE 40 MG TAB, DELAYED RELEASE
40 mg | ORAL_TABLET | Freq: Every day | ORAL | 1 refills | Status: DC
Start: 2019-02-16 — End: 2019-03-11

## 2019-02-16 MED ORDER — ALBUTEROL SULFATE HFA 90 MCG/ACTUATION AEROSOL INHALER
90 mcg/actuation | RESPIRATORY_TRACT | 11 refills | Status: DC | PRN
Start: 2019-02-16 — End: 2019-06-15

## 2019-02-16 MED ORDER — AMLODIPINE 10 MG TAB
10 mg | ORAL_TABLET | Freq: Every day | ORAL | 3 refills | Status: DC
Start: 2019-02-16 — End: 2019-06-15

## 2019-02-16 MED ORDER — MIRTAZAPINE 30 MG TAB
30 mg | ORAL_TABLET | Freq: Every evening | ORAL | 1 refills | Status: DC
Start: 2019-02-16 — End: 2019-06-15

## 2019-02-16 NOTE — Progress Notes (Signed)
Normal kidney liver and blood count  Message sent on my chart
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
## 2019-02-16 NOTE — Progress Notes (Signed)
Reviewed record in preparation for visit and have obtained necessary documentation.    Identified pt with two pt identifiers(name and DOB).    Chief Complaint   Patient presents with   . GERD       Health Maintenance Due   Topic Date Due   . Pneumococcal Vaccine (1 of 1 - PPSV23) 05/06/2015   . Yearly Flu Vaccine (1) 09/30/2018       John Duke has a reminder for a "due or due soon" health maintenance. I have asked that he discuss this further with his primary care provider for follow-up on this health maintenance.      Coordination of Care Questionnaire:  :     1) Have you been to an emergency room, urgent care clinic since your last visit? no   Hospitalized since your last visit? no             2) Have you seen or consulted any other health care providers outside of [facility] since your last visit? no  (Include any pap smears or colon screenings in this section.)    3) In the event something were to happen to you and you were unable to speak on your behalf, do you have an Advance Directive/ Living Will in place stating your wishes? NO    Do you have an Advance Directive on file? no    4) Are you interested in receiving information on Advance Directives? NO    Patient is accompanied by self I have received verbal consent from John Duke to discuss any/all medical information while they are present in the room.
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
## 2019-02-16 NOTE — Progress Notes (Signed)
Normal kidney liver and blood count  Message sent on my chart

## 2019-02-16 NOTE — Progress Notes (Signed)
Reviewed record in preparation for visit and have obtained necessary documentation.    Identified pt with two pt identifiers(name and DOB).    Chief Complaint   Patient presents with   ??? GERD       Health Maintenance Due   Topic Date Due   ??? Pneumococcal Vaccine (1 of 1 - PPSV23) 05/06/2015   ??? Yearly Flu Vaccine (1) 09/30/2018       Mr. Frentz has a reminder for a "due or due soon" health maintenance. I have asked that he discuss this further with his primary care provider for follow-up on this health maintenance.      Coordination of Care Questionnaire:  :     1) Have you been to an emergency room, urgent care clinic since your last visit? no   Hospitalized since your last visit? no             2) Have you seen or consulted any other health care providers outside of [facility] since your last visit? no  (Include any pap smears or colon screenings in this section.)    3) In the event something were to happen to you and you were unable to speak on your behalf, do you have an Advance Directive/ Living Will in place stating your wishes? NO    Do you have an Advance Directive on file? no    4) Are you interested in receiving information on Advance Directives? NO    Patient is accompanied by self I have received verbal consent from Emersyn Notte to discuss any/all medical information while they are present in the room.

## 2019-03-11 ENCOUNTER — Encounter

## 2019-03-11 MED ORDER — PANTOPRAZOLE 40 MG TAB, DELAYED RELEASE
40 mg | ORAL_TABLET | ORAL | 1 refills | Status: DC
Start: 2019-03-11 — End: 2019-04-11

## 2019-03-20 MED ORDER — AMOXICILLIN CLAVULANATE 875 MG-125 MG TAB
875-125 mg | ORAL_TABLET | Freq: Two times a day (BID) | ORAL | 0 refills | Status: DC
Start: 2019-03-20 — End: 2019-06-15

## 2019-03-20 NOTE — Telephone Encounter (Signed)
Spoke with patient.  Two pt identifiers confirmed.   Patient states that he has an abscessed tooth and can not get into to see anyone until next week at the [facility].  Patient would like to know if Dr. Vida Rigger can send in a prescription for an antibiotic to help him until he can see someone.  Advised patient that I will check with Dr. Vida Rigger and give him a call back as soon as she responds.  Pt verbalized understanding of information discussed w/ no further questions at this time.

## 2019-03-20 NOTE — Telephone Encounter (Signed)
Prescription sent   Orders Placed This Encounter   ??? amoxicillin-clavulanate (AUGMENTIN) 875-125 mg per tablet     Sig: Take 1 Tab by mouth two (2) times a day.     Dispense:  20 Tab     Refill:  0

## 2019-03-20 NOTE — Telephone Encounter (Signed)
Patient has a tooth abscess with a lot of facial swelling.

## 2019-03-28 LAB — METABOLIC PANEL, COMPREHENSIVE
A-G Ratio: 1.5 (ref 1.2–2.2)
ALT (SGPT): 37 IU/L (ref 0–44)
AST (SGOT): 26 IU/L (ref 0–40)
Albumin: 4.4 g/dL (ref 4.0–5.0)
Alk. phosphatase: 94 IU/L (ref 39–117)
BUN/Creatinine ratio: 13 (ref 9–20)
BUN: 12 mg/dL (ref 6–24)
Bilirubin, total: 0.3 mg/dL (ref 0.0–1.2)
CO2: 26 mmol/L (ref 20–29)
Calcium: 9.6 mg/dL (ref 8.7–10.2)
Chloride: 99 mmol/L (ref 96–106)
Creatinine: 0.9 mg/dL (ref 0.76–1.27)
GFR est AA: 123 mL/min/{1.73_m2} (ref >59)
GFR est non-AA: 106 mL/min/{1.73_m2} (ref >59)
GLOBULIN, TOTAL: 3 g/dL (ref 1.5–4.5)
Glucose: 86 mg/dL (ref 65–99)
Potassium: 4.4 mmol/L (ref 3.5–5.2)
Protein, total: 7.4 g/dL (ref 6.0–8.5)
Sodium: 140 mmol/L (ref 134–144)

## 2019-03-28 LAB — CBC WITH AUTOMATED DIFF
ABS. BASOPHILS: 0.1 10*3/uL (ref 0.0–0.2)
ABS. EOSINOPHILS: 0.4 10*3/uL (ref 0.0–0.4)
ABS. IMM. GRANS.: 0 10*3/uL (ref 0.0–0.1)
ABS. MONOCYTES: 0.5 10*3/uL (ref 0.1–0.9)
ABS. NEUTROPHILS: 5.5 10*3/uL (ref 1.4–7.0)
Abs Lymphocytes: 2.4 10*3/uL (ref 0.7–3.1)
BASOPHILS: 1 %
EOSINOPHILS: 4 %
HCT: 38.5 % (ref 37.5–51.0)
HGB: 13.2 g/dL (ref 13.0–17.7)
IMMATURE GRANULOCYTES: 0 %
Lymphocytes: 27 %
MCH: 28.9 pg (ref 26.6–33.0)
MCHC: 34.3 g/dL (ref 31.5–35.7)
MCV: 84 fL (ref 79–97)
MONOCYTES: 6 %
NEUTROPHILS: 62 %
PLATELET: 352 10*3/uL (ref 150–450)
RBC: 4.57 x10E6/uL (ref 4.14–5.80)
RDW: 12.9 % (ref 11.6–15.4)
WBC: 8.9 10*3/uL (ref 3.4–10.8)

## 2019-03-28 LAB — CBC WITH AUTO DIFFERENTIAL
Basophils %: 1 %
Basophils Absolute: 0.1 10*3/uL (ref 0.0–0.2)
Eosinophils %: 4 %
Eosinophils Absolute: 0.4 10*3/uL (ref 0.0–0.4)
Granulocyte Absolute Count: 0 10*3/uL (ref 0.0–0.1)
Hematocrit: 38.5 % (ref 37.5–51.0)
Hemoglobin: 13.2 g/dL (ref 13.0–17.7)
Immature Granulocytes: 0 %
Lymphocytes %: 27 %
Lymphocytes Absolute: 2.4 10*3/uL (ref 0.7–3.1)
MCH: 28.9 pg (ref 26.6–33.0)
MCHC: 34.3 g/dL (ref 31.5–35.7)
MCV: 84 fL (ref 79–97)
Monocytes %: 6 %
Monocytes Absolute: 0.5 10*3/uL (ref 0.1–0.9)
Neutrophils %: 62 %
Neutrophils Absolute: 5.5 10*3/uL (ref 1.4–7.0)
Platelets: 352 10*3/uL (ref 150–450)
RBC: 4.57 x10E6/uL (ref 4.14–5.80)
RDW: 12.9 % (ref 11.6–15.4)
WBC: 8.9 10*3/uL (ref 3.4–10.8)

## 2019-03-28 LAB — COMPREHENSIVE METABOLIC PANEL
ALT: 37 IU/L (ref 0–44)
AST: 26 IU/L (ref 0–40)
Albumin/Globulin Ratio: 1.5 NA (ref 1.2–2.2)
Albumin: 4.4 g/dL (ref 4.0–5.0)
Alkaline Phosphatase: 94 IU/L (ref 39–117)
BUN: 12 mg/dL (ref 6–24)
Bun/Cre Ratio: 13 NA (ref 9–20)
CO2: 26 mmol/L (ref 20–29)
Calcium: 9.6 mg/dL (ref 8.7–10.2)
Chloride: 99 mmol/L (ref 96–106)
Creatinine: 0.9 mg/dL (ref 0.76–1.27)
EGFR IF NonAfrican American: 106 mL/min/{1.73_m2} (ref >59)
GFR African American: 123 mL/min/{1.73_m2} (ref >59)
Globulin, Total: 3 g/dL (ref 1.5–4.5)
Glucose: 86 mg/dL (ref 65–99)
Potassium: 4.4 mmol/L (ref 3.5–5.2)
Sodium: 140 mmol/L (ref 134–144)
Total Bilirubin: 0.3 mg/dL (ref 0.0–1.2)
Total Protein: 7.4 g/dL (ref 6.0–8.5)

## 2019-04-03 ENCOUNTER — Telehealth: Attending: Family Medicine | Primary: Internal Medicine

## 2019-04-03 ENCOUNTER — Ambulatory Visit: Payer: MEDICAID | Attending: Family Medicine | Primary: Internal Medicine

## 2019-04-03 ENCOUNTER — Telehealth: Admit: 2019-04-03 | Payer: MEDICAID | Attending: Family Medicine | Primary: Internal Medicine

## 2019-04-03 DIAGNOSIS — R109 Unspecified abdominal pain: Secondary | ICD-10-CM

## 2019-04-03 MED ORDER — METHYLPREDNISOLONE 4 MG TABS IN A DOSE PACK
4 mg | ORAL | 0 refills | Status: DC
Start: 2019-04-03 — End: 2019-06-15
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
## 2019-04-03 NOTE — Telephone Encounter (Signed)
Attempted to call patient  No answer.  Unable to leave voicemail.  Mailbox full.
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
## 2019-04-11 ENCOUNTER — Encounter

## 2019-04-11 MED ORDER — PANTOPRAZOLE 40 MG TAB, DELAYED RELEASE
40 mg | ORAL_TABLET | ORAL | 1 refills | Status: DC
Start: 2019-04-11 — End: 2019-05-04

## 2019-05-03 ENCOUNTER — Encounter

## 2019-05-05 MED ORDER — PANTOPRAZOLE 40 MG TAB, DELAYED RELEASE
40 mg | ORAL_TABLET | ORAL | 1 refills | Status: DC
Start: 2019-05-05 — End: 2019-05-28

## 2019-05-08 NOTE — Telephone Encounter (Signed)
-----   Message from Osborn Coho sent at 05/08/2019  3:38 PM EDT -----  Regarding: Dr. Lindaann Slough  Contact: [phone]  General Message/Vendor Calls    Caller's first and last name: N/A      Reason for call: Requesting antibiotic for tooth abcess      Callback required yes/no and why: yes/confirm prescription called in       Best contact number(s):[phone]      Details to clarify the request: PT is scheduled to go to dentist in May.       Osborn Coho

## 2019-05-11 NOTE — Telephone Encounter (Signed)
MyChart message sent to patient regarding request for an antibiotic for an abscessed tooth.

## 2019-05-18 NOTE — Telephone Encounter (Signed)
John Hefty, MD  Lynelle Doctor, LPN   Caller: Unspecified (Today, 11:29 AM)      ??      Can proceed with covid vaccine same risks as general population      MyChart message sent to patient with results

## 2019-05-18 NOTE — Telephone Encounter (Addendum)
-----   Message from Francis Gaines sent at 05/18/2019 11:03 AM EDT -----  Regarding: Dr. Roselind Messier Telephone  Contact: [phone]  General Message/Vendor Calls    Caller's first and last name:pt      Reason for call: Vaccine Question      Callback required yes/no and why:yes      Best contact number(s):[phone]      Details to clarify the request: Pt is allergic to the TB shot but needs to know if he can get the covid vaccine. The scheduler needs to know before he can get scheduled.      Message from [facility]

## 2019-05-27 NOTE — Progress Notes (Signed)
error 

## 2019-05-28 ENCOUNTER — Encounter

## 2019-05-28 MED ORDER — PANTOPRAZOLE 40 MG TAB, DELAYED RELEASE
40 mg | ORAL_TABLET | ORAL | 5 refills | Status: DC
Start: 2019-05-28 — End: 2019-06-15

## 2019-05-28 NOTE — Telephone Encounter (Signed)
CPE scheduled in July

## 2019-06-15 ENCOUNTER — Telehealth: Attending: Internal Medicine | Primary: Internal Medicine

## 2019-06-15 ENCOUNTER — Telehealth: Admit: 2019-06-15 | Payer: PRIVATE HEALTH INSURANCE | Attending: Internal Medicine | Primary: Internal Medicine

## 2019-06-15 DIAGNOSIS — J4520 Mild intermittent asthma, uncomplicated: Secondary | ICD-10-CM

## 2019-06-15 MED ORDER — AMLODIPINE 10 MG TAB
10 mg | ORAL_TABLET | Freq: Every day | ORAL | 1 refills | Status: DC
Start: 2019-06-15 — End: 2019-09-02

## 2019-06-15 MED ORDER — ALBUTEROL SULFATE 90 MCG/ACTUATION BREATH ACTIVATED POWDER INHALER
90 mcg/actuation | Freq: Four times a day (QID) | RESPIRATORY_TRACT | 0 refills | Status: DC | PRN
Start: 2019-06-15 — End: 2019-09-02

## 2019-06-15 MED ORDER — HYDROXYZINE PAMOATE 25 MG CAP
25 mg | ORAL_CAPSULE | Freq: Every evening | ORAL | 1 refills | Status: AC | PRN
Start: 2019-06-15 — End: 2019-07-15

## 2019-06-15 MED ORDER — HYDROCHLOROTHIAZIDE 12.5 MG TAB
12.5 mg | ORAL_TABLET | Freq: Every day | ORAL | 1 refills | Status: DC
Start: 2019-06-15 — End: 2019-09-02

## 2019-06-15 MED ORDER — MIRTAZAPINE 30 MG TAB
30 mg | ORAL_TABLET | Freq: Every evening | ORAL | 1 refills | Status: DC
Start: 2019-06-15 — End: 2019-09-02

## 2019-06-15 MED ORDER — PREGABALIN 50 MG CAP
50 mg | ORAL_CAPSULE | Freq: Three times a day (TID) | ORAL | 1 refills | Status: DC
Start: 2019-06-15 — End: 2019-07-13

## 2019-06-15 MED ORDER — PANTOPRAZOLE 40 MG TAB, DELAYED RELEASE
40 mg | ORAL_TABLET | Freq: Two times a day (BID) | ORAL | 1 refills | Status: DC
Start: 2019-06-15 — End: 2019-09-02

## 2019-06-15 MED ORDER — MONTELUKAST 10 MG TAB
10 mg | ORAL_TABLET | Freq: Every day | ORAL | 1 refills | Status: DC
Start: 2019-06-15 — End: 2019-09-02
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
## 2019-06-18 ENCOUNTER — Telehealth

## 2019-06-18 MED ORDER — AMOXICILLIN CLAVULANATE 875 MG-125 MG TAB
875-125 mg | ORAL_TABLET | Freq: Two times a day (BID) | ORAL | 0 refills | Status: DC
Start: 2019-06-18 — End: 2019-07-13

## 2019-06-18 NOTE — Telephone Encounter (Signed)
Patient's emergency contact Franciso Bend came into the office to pick up orders for patient. Ms. Maple Hudson stated that the patient is requesting an antibiotic but details were not given to her. Please call patient at [phone] to further discuss.

## 2019-06-18 NOTE — Telephone Encounter (Signed)
Patient called today about congestion , coughing, yellow nasal drainage, poor appetite . Stated this has been going on 1-2 weeks and forgot to mention at visit on 06/15/19. Has been trying mucinex and dayquil and ineffective, denies fever , sob or any loss of taste or smell, has not gotten covid vaccine yet . requesting md advisement from md

## 2019-06-19 NOTE — Telephone Encounter (Signed)
-----   Message from Verlin Grills sent at 06/19/2019  1:32 PM EDT -----  Regarding: Dr. Lindaann Slough  General Message/Vendor Calls    Caller's first and last name: Selena Batten from [employer]      Reason for call: Checking status of medical necessity forms faxed for this patient.       Callback required yes/no and why: yes, to confirm.       Best contact number(s): [phone]      Details to clarify the request: N/A      Message from [facility]

## 2019-06-19 NOTE — Telephone Encounter (Addendum)
-----   Message from Kristy Crabtree sent at 06/19/2019  1:32 PM EDT -----  Regarding: Dr. Appa Falcao/Telephone  General Message/Vendor Calls    Caller's first and last name: Kim from [employer]      Reason for call: Checking status of medical necessity forms faxed for this patient.       Callback required yes/no and why: yes, to confirm.       Best contact number(s): [phone]      Details to clarify the request: N/A      Message from ENVER[facility]

## 2019-06-22 NOTE — Telephone Encounter (Signed)
Forms have been completed and sent .

## 2019-06-22 NOTE — Telephone Encounter (Signed)
Forms have been completed and sent .

## 2019-07-13 ENCOUNTER — Telehealth: Attending: Internal Medicine | Primary: Internal Medicine

## 2019-07-13 ENCOUNTER — Telehealth: Admit: 2019-07-13 | Payer: PRIVATE HEALTH INSURANCE | Attending: Internal Medicine | Primary: Internal Medicine

## 2019-07-13 DIAGNOSIS — K047 Periapical abscess without sinus: Secondary | ICD-10-CM

## 2019-07-13 MED ORDER — PREGABALIN 100 MG CAP
100 mg | ORAL_CAPSULE | Freq: Two times a day (BID) | ORAL | 1 refills | Status: DC
Start: 2019-07-13 — End: 2019-12-15

## 2019-07-13 MED ORDER — AMOXICILLIN CLAVULANATE 875 MG-125 MG TAB
875-125 mg | ORAL_TABLET | Freq: Two times a day (BID) | ORAL | 0 refills | Status: DC
Start: 2019-07-13 — End: 2019-12-15

## 2019-07-13 NOTE — Progress Notes (Signed)
Reviewed record in preparation for visit and have obtained necessary documentation.    Identified pt with two pt identifiers(name and DOB).    Chief Complaint   Patient presents with   . Back Pain       Health Maintenance Due   Topic Date Due   . COVID-19 Vaccine (1) Never done   . Pneumococcal Vaccine (1 of 2 - PPSV23) 05/06/2015       John Duke has a reminder for a "due or due soon" health maintenance. I have asked that he discuss this further with his primary care provider for follow-up on this health maintenance.      Coordination of Care Questionnaire:  :     1) Have you been to an emergency room, urgent care clinic since your last visit? no   Hospitalized since your last visit? no             2) Have you seen or consulted any other health care providers outside of [facility] since your last visit? no  (Include any pap smears or colon screenings in this section.)    3) In the event something were to happen to you and you were unable to speak on your behalf, do you have an Advance Directive/ Living Will in place stating your wishes? NO    Do you have an Advance Directive on file? no    4) Are you interested in receiving information on Advance Directives? NO    Patient is accompanied by self I have received verbal consent from John Duke to discuss any/all medical information while they are present in the room.
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
## 2019-07-31 ENCOUNTER — Encounter: Attending: Internal Medicine | Primary: Internal Medicine

## 2019-08-28 ENCOUNTER — Encounter

## 2019-08-28 NOTE — Telephone Encounter (Signed)
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

## 2019-08-31 NOTE — Telephone Encounter (Signed)
This Clinical research associate contacted patient in reference to c/o testicular swelling and blood in urine. Two patient identifiers confirmed. Patient states he was evaluated at [facility] on Friday afternoon. States he was advised of tube swelling behind the scrotum. Prescribed an antibiotic and pain medication. Patient denies further episodes of blood in urine and minimal pain at the time of call.     Patient is requesting a sooner appointment with Dr. Vida Rigger.John Duke Has an appointment scheduled on 10/12/2019 at 08:45PM.  Patient states he understands he is currently due for a complete physical and labs. However patient states he requires orders for a nebulizer with compressor, adult pull on briefs and a referral to sleep study due to Family History of Sleep Apnea and Low O2 Readings at Night and medication refills. Patient was advised message will be forwarded to physician for further assistance. Patient verbalized understanding.

## 2019-09-01 MED ORDER — NEBULIZER & COMPRESSOR
0 refills | Status: AC
Start: 2019-09-01 — End: ?

## 2019-09-02 MED ORDER — HYDROCHLOROTHIAZIDE 12.5 MG TAB
12.5 mg | ORAL_TABLET | Freq: Every day | ORAL | 1 refills | Status: DC
Start: 2019-09-02 — End: 2019-12-15

## 2019-09-02 MED ORDER — MIRTAZAPINE 30 MG TAB
30 mg | ORAL_TABLET | Freq: Every evening | ORAL | 1 refills | Status: DC
Start: 2019-09-02 — End: 2019-12-15

## 2019-09-02 MED ORDER — ALBUTEROL SULFATE 90 MCG/ACTUATION BREATH ACTIVATED POWDER INHALER
90 mcg/actuation | Freq: Four times a day (QID) | RESPIRATORY_TRACT | 1 refills | Status: DC | PRN
Start: 2019-09-02 — End: 2019-12-15

## 2019-09-02 MED ORDER — PANTOPRAZOLE 40 MG TAB, DELAYED RELEASE
40 mg | ORAL_TABLET | Freq: Two times a day (BID) | ORAL | 1 refills | Status: DC
Start: 2019-09-02 — End: 2019-12-15

## 2019-09-02 MED ORDER — MONTELUKAST 10 MG TAB
10 mg | ORAL_TABLET | Freq: Every day | ORAL | 1 refills | Status: DC
Start: 2019-09-02 — End: 2019-12-15

## 2019-09-02 MED ORDER — AMLODIPINE 10 MG TAB
10 mg | ORAL_TABLET | Freq: Every day | ORAL | 1 refills | Status: DC
Start: 2019-09-02 — End: 2019-12-15

## 2019-09-02 NOTE — Addendum Note (Signed)
Addendum Note by Henderson [place], LPN at 96/29/52 [phone]                Author: Henderson [place], LPN  Service: --  Author Type: Licensed Nurse       Filed: 09/02/19 0824  Encounter Date: 08/28/2019  Status: Signed          Editor: Henderson [place], LPN (Licensed Nurse)          Addended by: Henderson [place] on: 09/02/2019 08:24 AM    Modules accepted: Orders

## 2019-09-25 NOTE — Telephone Encounter (Signed)
-----   Message from [facility] sent at 09/25/2019  2:17 PM EDT -----  Regarding: Dr. Appa-Falcao/Telephone  General Message/Vendor Calls    Caller's first and last name: Ali Molina, [facility]      Reason for call: Checking the status of Certificate of medical Necessity for BP Monitor      Callback required yes/no and why: Yes      Best contact number(s): [phone]      Details to clarify the request: N/A      Message from [facility]

## 2019-09-25 NOTE — Telephone Encounter (Signed)
-----   Message from [facility] sent at 09/25/2019  2:11 PM EDT -----  Regarding: Dr. Appa-Falcao/Telephone  General Message/Vendor Calls    Caller's first and last name: Irving Burton, [facility]      Reason for call: Checking the status of Certificate of Medical Necessity for incontinence supplies      Callback required yes/no and why: Yes      Best contact number(s): [phone]      Message from [facility]

## 2019-10-07 IMAGING — DX DG CHEST 2V
2 series · 2 of 2 positions shown · non-contrast
Comparison: None.

CLINICAL DATA: Cough

EXAM:
CHEST  2 VIEW

[chest pa]
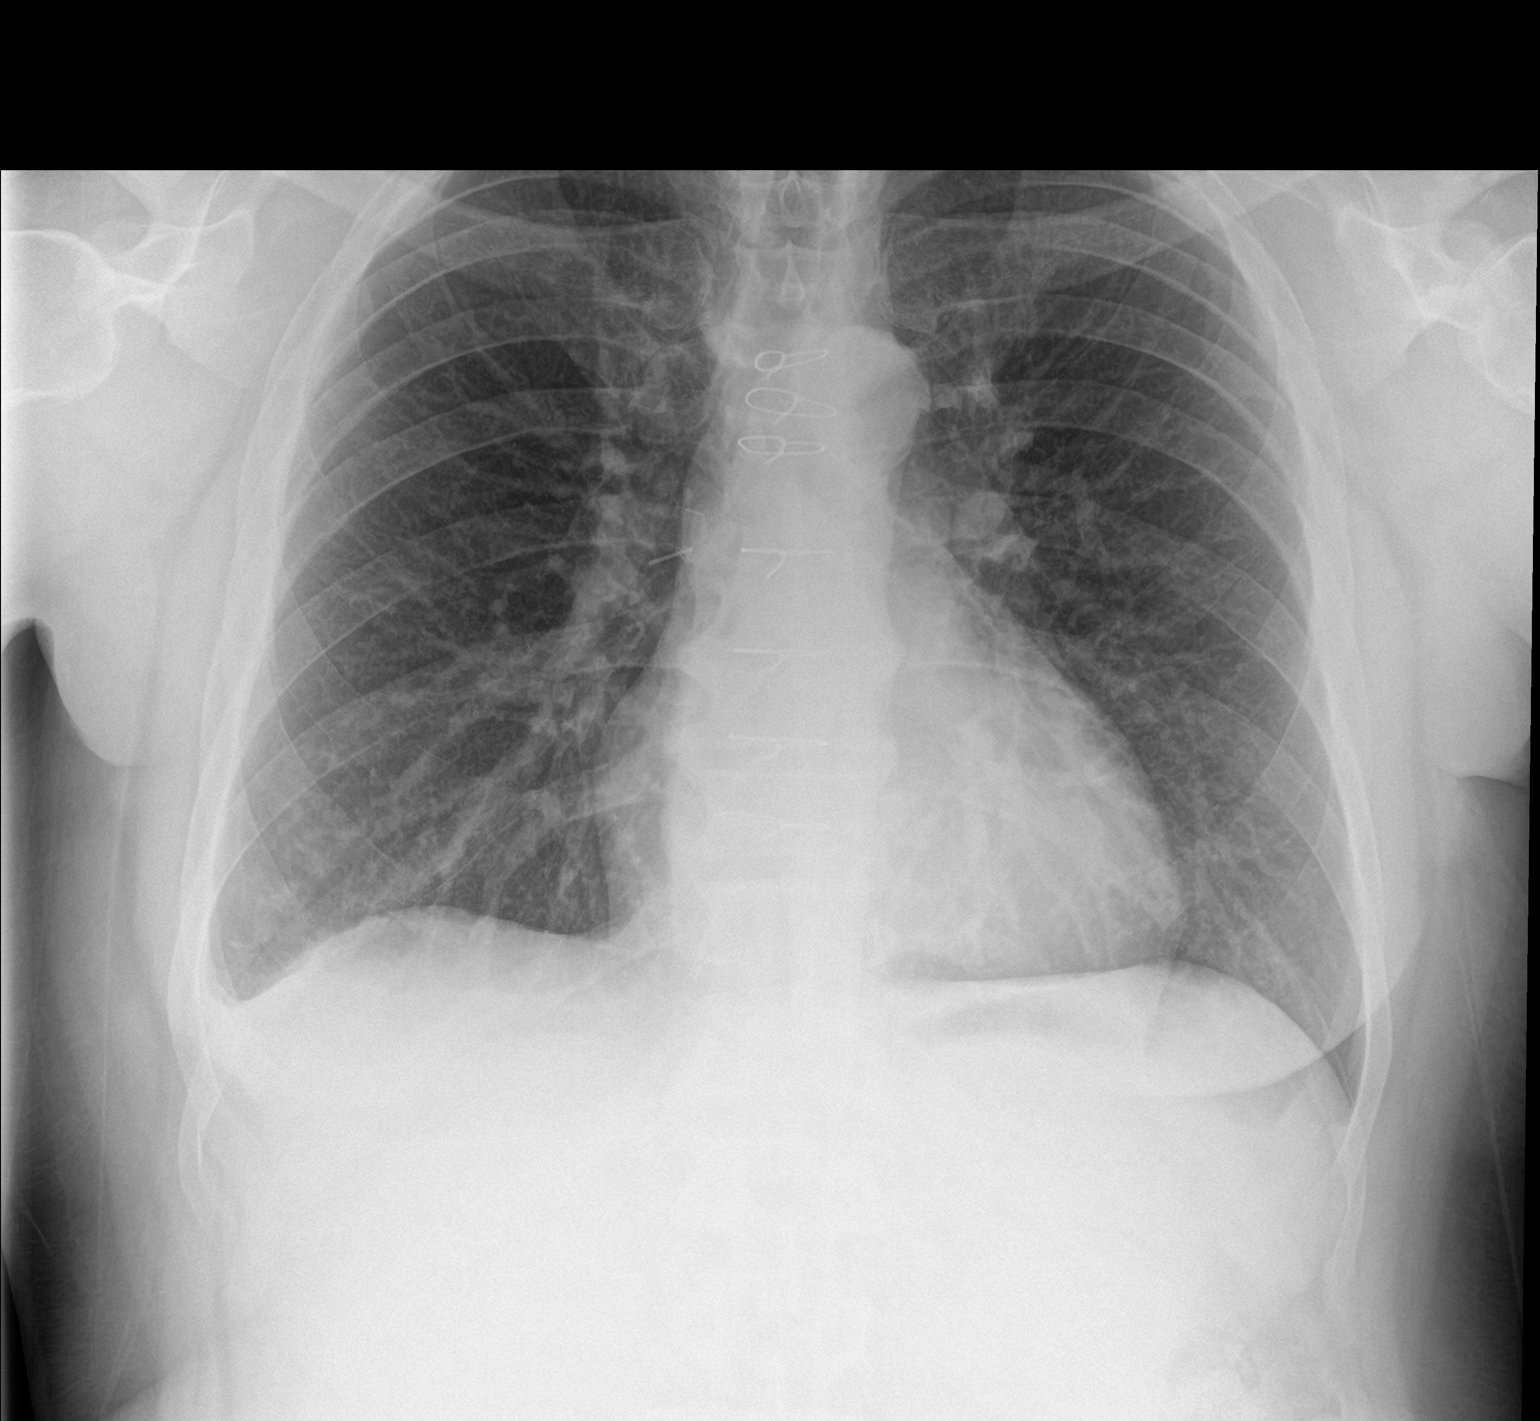

[chest lat]
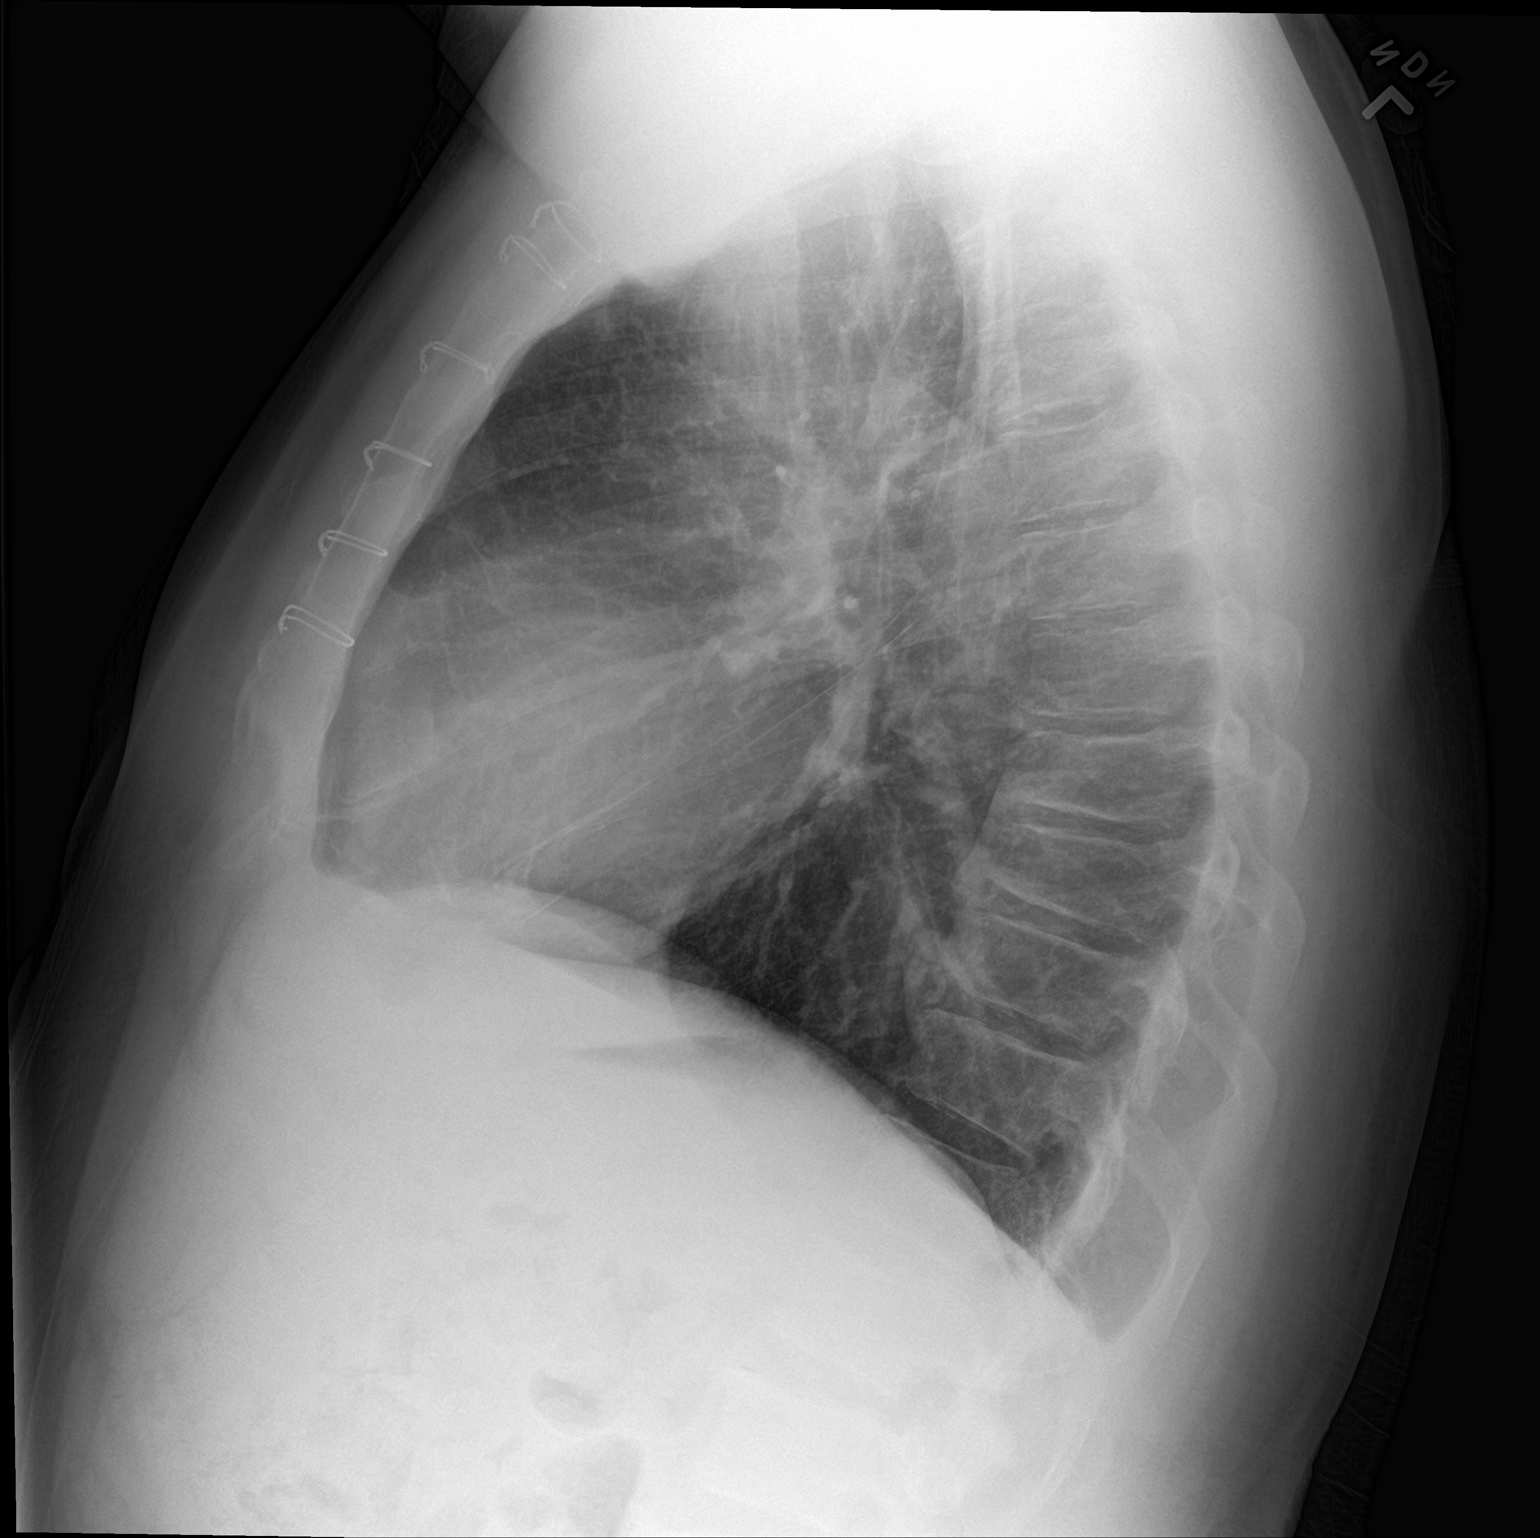

[2 of 2 positions shown; findings below may reference images not displayed]

FINDINGS: The heart size and mediastinal contours are within normal limits.
Both lungs are clear. The visualized skeletal structures are
unremarkable.
IMPRESSION: No active cardiopulmonary disease.

## 2019-10-07 NOTE — Telephone Encounter (Signed)
#  [phone] [facility] states they need a verbal for incontinance supplies.  This needs to come from NP, PA, DO or DR.    They will be faxing this order over now, but would like that verbal today if possible.     Their fax #[phone]

## 2019-10-08 NOTE — Telephone Encounter (Signed)
Home Care [facility] form was completed and faxed to [phone].for BP monitor.

## 2019-10-12 ENCOUNTER — Telehealth

## 2019-10-12 ENCOUNTER — Ambulatory Visit: Payer: PRIVATE HEALTH INSURANCE | Attending: Internal Medicine | Primary: Internal Medicine

## 2019-10-12 NOTE — Telephone Encounter (Signed)
Patient is requesting a new medication for a skin problem and medication for his nebulizer . Patient is scheduled for the next available virtual visit with Dr. Vida Rigger on 10/27/19.     Patient is also requesting an update on form completion from his medical equipment company on receiving a blood pressure cuff.     Patient can be contacted at [phone]. If unavailable, please contact patient's aid [place] at [phone].

## 2019-10-13 MED ORDER — ALBUTEROL SULFATE 0.083 % (0.83 MG/ML) SOLN FOR INHALATION
2.5 mg /3 mL (0.083 %) | INHALATION_SOLUTION | Freq: Four times a day (QID) | RESPIRATORY_TRACT | 2 refills | Status: DC | PRN
Start: 2019-10-13 — End: 2020-05-04

## 2019-10-13 NOTE — Telephone Encounter (Signed)
I sent albuterol nebulizer to pharmacy  As far as skin issue I need to evaluate before prescribing unless a refill is needed for a certain cream for an existing condition , if so please pend that to me  Regards    Dr Vida Rigger

## 2019-10-15 NOTE — Telephone Encounter (Signed)
-----   Message from [place] sent at 10/15/2019  4:15 PM EDT -----  Regarding: Pincus Badder  General Message/Vendor Calls    Caller's first and last name:[facility]      Reason for call:Fax order      Callback required yes/no and why:yes, fax order for shipping supplies      Best contact number(s):[phone]      Details to clarify the request:fax order for shipping supplies      Message from [facility]

## 2019-10-27 ENCOUNTER — Telehealth: Attending: Internal Medicine | Primary: Internal Medicine

## 2019-10-27 ENCOUNTER — Telehealth: Admit: 2019-10-27 | Payer: PRIVATE HEALTH INSURANCE | Attending: Internal Medicine | Primary: Internal Medicine

## 2019-10-27 ENCOUNTER — Telehealth

## 2019-10-27 ENCOUNTER — Ambulatory Visit: Payer: PRIVATE HEALTH INSURANCE | Primary: Internal Medicine

## 2019-10-27 DIAGNOSIS — M7989 Other specified soft tissue disorders: Secondary | ICD-10-CM

## 2019-10-27 MED ORDER — OXYCODONE-ACETAMINOPHEN 7.5 MG-325 MG TAB
ORAL_TABLET | Freq: Three times a day (TID) | ORAL | 0 refills | Status: AC | PRN
Start: 2019-10-27 — End: 2019-11-03

## 2019-10-27 MED ORDER — TADALAFIL 5 MG TABLET
5 mg | ORAL_TABLET | Freq: Every day | ORAL | 0 refills | Status: DC | PRN
Start: 2019-10-27 — End: 2019-10-28

## 2019-10-27 MED ORDER — TRIMETHOPRIM-SULFAMETHOXAZOLE 160 MG-800 MG TAB
160-800 mg | ORAL_TABLET | Freq: Two times a day (BID) | ORAL | 0 refills | Status: DC
Start: 2019-10-27 — End: 2019-12-15

## 2019-10-27 NOTE — Progress Notes (Signed)
 John Duke is a 39 y.o. male  HIPAA verified by two patient identifiers.   Health Maintenance Due   Topic   . COVID-19 Vaccine (1)   . Pneumococcal 0-64 years (1 of 2 - PPSV23)   . Flu Vaccine (1)       Chief Complaint   Patient presents with   . Medication Refill     Patient-Reported Vitals 10/27/2019   Patient-Reported Weight 290lb   Patient-Reported Pulse 107   Patient-Reported Systolic  134   Patient-Reported Diastolic 87          Pain Scale: 5/10  Pain Location:  left leg       Pain Location:   1. Have you been to the ER, urgent care clinic since your last visit?  Hospitalized since your last visit?No    2. Have you seen or consulted any other health care providers outside of the [facility] since your last visit?  Include any pap smears or colon screening. No

## 2019-10-27 NOTE — Telephone Encounter (Signed)
PA was started for tadalafiL (CIALIS) 5 mg tablet.     The PA asked if you would want to prescribe one of the following:     SILDENAFIL CITRATE 20 MG   REVATIO 10 MG/ML   SILDENAFIL CITRATE 10 MG/ML
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
## 2019-10-27 NOTE — Telephone Encounter (Signed)
Tadalafil 5MG  tablets       Status: PA Response - Denied

## 2019-10-28 ENCOUNTER — Encounter

## 2019-10-28 MED ORDER — SILDENAFIL 25 MG TAB
25 mg | ORAL_TABLET | ORAL | 1 refills | Status: DC | PRN
Start: 2019-10-28 — End: 2019-10-30

## 2019-10-28 NOTE — Addendum Note (Signed)
Addendum Note by Elissa Hefty, MD at 10/28/19 1215                Author: Elissa Hefty, MD  Service: --  Author Type: Physician       Filed: 10/28/19 1215  Encounter Date: 10/27/2019  Status: Signed          Editor: Elissa Hefty, MD (Physician)          Addended by: Roselind Messier, Meriel Pica on: 10/28/2019 12:15 PM    Modules accepted: Orders

## 2019-10-30 ENCOUNTER — Inpatient Hospital Stay: Admit: 2019-10-30 | Payer: PRIVATE HEALTH INSURANCE | Attending: Internal Medicine | Primary: Internal Medicine

## 2019-10-30 ENCOUNTER — Encounter

## 2019-10-30 ENCOUNTER — Other Ambulatory Visit: Admit: 2019-10-30 | Payer: PRIVATE HEALTH INSURANCE | Primary: Internal Medicine

## 2019-10-30 DIAGNOSIS — M7989 Other specified soft tissue disorders: Secondary | ICD-10-CM

## 2019-10-30 DIAGNOSIS — E7800 Pure hypercholesterolemia, unspecified: Secondary | ICD-10-CM

## 2019-10-30 LAB — CBC WITH AUTOMATED DIFF
ABS. BASOPHILS: 0.1 10*3/uL (ref 0.0–0.1)
ABS. EOSINOPHILS: 0.6 10*3/uL — ABNORMAL HIGH (ref 0.0–0.4)
ABS. IMM. GRANS.: 0 10*3/uL (ref 0.00–0.04)
ABS. LYMPHOCYTES: 2.3 10*3/uL (ref 0.8–3.5)
ABS. MONOCYTES: 0.5 10*3/uL (ref 0.0–1.0)
ABS. NEUTROPHILS: 5.6 10*3/uL (ref 1.8–8.0)
ABSOLUTE NRBC: 0 10*3/uL (ref 0.00–0.01)
BASOPHILS: 1 % (ref 0–1)
EOSINOPHILS: 7 % (ref 0–7)
HCT: 37.2 % (ref 36.6–50.3)
HGB: 12.4 g/dL (ref 12.1–17.0)
IMMATURE GRANULOCYTES: 0 % (ref 0.0–0.5)
LYMPHOCYTES: 26 % (ref 12–49)
MCH: 27.5 PG (ref 26.0–34.0)
MCHC: 33.3 g/dL (ref 30.0–36.5)
MCV: 82.5 FL (ref 80.0–99.0)
MONOCYTES: 5 % (ref 5–13)
MPV: 9.7 FL (ref 8.9–12.9)
NEUTROPHILS: 61 % (ref 32–75)
NRBC: 0 PER 100 WBC
PLATELET: 475 10*3/uL — ABNORMAL HIGH (ref 150–400)
RBC: 4.51 M/uL (ref 4.10–5.70)
RDW: 13.1 % (ref 11.5–14.5)
WBC: 9.1 10*3/uL (ref 4.1–11.1)

## 2019-10-30 LAB — METABOLIC PANEL, COMPREHENSIVE
A-G Ratio: 0.8 — ABNORMAL LOW (ref 1.1–2.2)
ALT (SGPT): 41 U/L (ref 12–78)
AST (SGOT): 30 U/L (ref 15–37)
Albumin: 3.6 g/dL (ref 3.5–5.0)
Alk. phosphatase: 123 U/L — ABNORMAL HIGH (ref 45–117)
Anion gap: 3 mmol/L — ABNORMAL LOW (ref 5–15)
BUN/Creatinine ratio: 13 (ref 12–20)
BUN: 12 MG/DL (ref 6–20)
Bilirubin, total: 0.4 MG/DL (ref 0.2–1.0)
CO2: 30 mmol/L (ref 21–32)
Calcium: 9.4 MG/DL (ref 8.5–10.1)
Chloride: 107 mmol/L (ref 97–108)
Creatinine: 0.93 MG/DL (ref 0.70–1.30)
GFR est AA: >60 mL/min/{1.73_m2} (ref >60)
GFR est non-AA: >60 mL/min/{1.73_m2} (ref >60)
Globulin: 4.3 g/dL — ABNORMAL HIGH (ref 2.0–4.0)
Glucose: 157 mg/dL — ABNORMAL HIGH (ref 65–100)
Potassium: 4.7 mmol/L (ref 3.5–5.1)
Protein, total: 7.9 g/dL (ref 6.4–8.2)
Sodium: 140 mmol/L (ref 136–145)

## 2019-10-30 LAB — LIPID PANEL
CHOL/HDL Ratio: 6.2 — ABNORMAL HIGH (ref 0.0–5.0)
Chol/HDL Ratio: 6.2 — ABNORMAL HIGH (ref 0.0–5.0)
Cholesterol, Total: 210 MG/DL — ABNORMAL HIGH (ref <200)
Cholesterol, total: 210 MG/DL — ABNORMAL HIGH (ref <200)
HDL Cholesterol: 34 MG/DL
HDL: 34 MG/DL
LDL Calculated: 119.2 MG/DL — ABNORMAL HIGH (ref 0–100)
LDL, calculated: 119.2 MG/DL — ABNORMAL HIGH (ref 0–100)
Triglyceride: 284 MG/DL — ABNORMAL HIGH (ref <150)
Triglycerides: 284 MG/DL — ABNORMAL HIGH (ref <150)
VLDL Cholesterol Calculated: 56.8 MG/DL
VLDL, calculated: 56.8 MG/DL

## 2019-10-30 LAB — COMPREHENSIVE METABOLIC PANEL
ALT: 41 U/L (ref 12–78)
AST: 30 U/L (ref 15–37)
Albumin/Globulin Ratio: 0.8 — ABNORMAL LOW (ref 1.1–2.2)
Albumin: 3.6 g/dL (ref 3.5–5.0)
Alkaline Phosphatase: 123 U/L — ABNORMAL HIGH (ref 45–117)
Anion Gap: 3 mmol/L — ABNORMAL LOW (ref 5–15)
BUN: 12 MG/DL (ref 6–20)
Bun/Cre Ratio: 13 (ref 12–20)
CO2: 30 mmol/L (ref 21–32)
Calcium: 9.4 MG/DL (ref 8.5–10.1)
Chloride: 107 mmol/L (ref 97–108)
Creatinine: 0.93 MG/DL (ref 0.70–1.30)
EGFR IF NonAfrican American: >60 mL/min/{1.73_m2} (ref >60)
GFR African American: >60 mL/min/{1.73_m2} (ref >60)
Globulin: 4.3 g/dL — ABNORMAL HIGH (ref 2.0–4.0)
Glucose: 157 mg/dL — ABNORMAL HIGH (ref 65–100)
Potassium: 4.7 mmol/L (ref 3.5–5.1)
Sodium: 140 mmol/L (ref 136–145)
Total Bilirubin: 0.4 MG/DL (ref 0.2–1.0)
Total Protein: 7.9 g/dL (ref 6.4–8.2)

## 2019-10-30 LAB — CBC WITH AUTO DIFFERENTIAL
Basophils %: 1 % (ref 0–1)
Basophils Absolute: 0.1 10*3/uL (ref 0.0–0.1)
Eosinophils %: 7 % (ref 0–7)
Eosinophils Absolute: 0.6 10*3/uL — ABNORMAL HIGH (ref 0.0–0.4)
Granulocyte Absolute Count: 0 10*3/uL (ref 0.00–0.04)
Hematocrit: 37.2 % (ref 36.6–50.3)
Hemoglobin: 12.4 g/dL (ref 12.1–17.0)
Immature Granulocytes: 0 % (ref 0.0–0.5)
Lymphocytes %: 26 % (ref 12–49)
Lymphocytes Absolute: 2.3 10*3/uL (ref 0.8–3.5)
MCH: 27.5 PG (ref 26.0–34.0)
MCHC: 33.3 g/dL (ref 30.0–36.5)
MCV: 82.5 FL (ref 80.0–99.0)
MPV: 9.7 FL (ref 8.9–12.9)
Monocytes %: 5 % (ref 5–13)
Monocytes Absolute: 0.5 10*3/uL (ref 0.0–1.0)
NRBC Absolute: 0 10*3/uL (ref 0.00–0.01)
Neutrophils %: 61 % (ref 32–75)
Neutrophils Absolute: 5.6 10*3/uL (ref 1.8–8.0)
Nucleated RBCs: 0 PER 100 WBC
Platelets: 475 10*3/uL — ABNORMAL HIGH (ref 150–400)
RBC: 4.51 M/uL (ref 4.10–5.70)
RDW: 13.1 % (ref 11.5–14.5)
WBC: 9.1 10*3/uL (ref 4.1–11.1)

## 2019-10-30 MED ORDER — SILDENAFIL 25 MG TAB
25 mg | ORAL_TABLET | ORAL | 1 refills | Status: DC | PRN
Start: 2019-10-30 — End: 2019-11-12

## 2019-10-30 NOTE — Progress Notes (Signed)
Sent patient message VIA MyChart with lab results.

## 2019-10-30 NOTE — Telephone Encounter (Signed)
Spoke to patient . His insurance declined the medication. Patient asked for it to be sent to [facility] . He can get it there for $18.00. Medication was sent in per Dr Roselind Messier.

## 2019-10-30 NOTE — Progress Notes (Signed)
Blood sugar is elevated  Normal blood count  Triglycerides are high and LDL is elevated will discuss results at follow up appointment next month

## 2019-10-30 NOTE — Telephone Encounter (Signed)
 Future Appointments:  Future Appointments   Date Time Provider Department Center   10/30/2019 10:05 AM [facility] AMB   11/17/2019  9:40 AM Herold Ted CROME, MD [facility] BS AMB   12/08/2019  9:30 AM Appa Falcao, Raquel, MD [facility] BS AMB        Last Appointment With Me:  10/27/2019     Requested Prescriptions     Pending Prescriptions Disp Refills   . sildenafil  citrate (VIAGRA ) 25 mg tablet 6 Tablet 1     Sig: Take 1 Tablet by mouth as needed for Erectile Dysfunction.

## 2019-10-30 NOTE — Progress Notes (Signed)
Sent patient message VIA MyChart with lab results.

## 2019-10-30 NOTE — Telephone Encounter (Signed)
Called and left a detailed message for supply company.

## 2019-10-30 NOTE — Telephone Encounter (Signed)
#  [phone] [facility] states they will take a verbal from doctor, NP or PA for incontinence supplies and then will take the order as they are waiting on the CMN.      Please call to give verbal so pt can get supplies started.  Thanks.

## 2019-10-30 NOTE — Telephone Encounter (Signed)
 Pt came in for labs stating that their medication Viagra  has not gone through and the pharmacy has ben trying to reach out. Pt is unable to get the medication. Please advise.

## 2019-10-30 NOTE — Progress Notes (Signed)
??  No evidence of deep vein and superficial thrombosis in the left lower extremity.

## 2019-11-12 ENCOUNTER — Telehealth

## 2019-11-12 MED ORDER — SILDENAFIL 50 MG TAB
50 mg | ORAL_TABLET | Freq: Every day | ORAL | 1 refills | Status: DC | PRN
Start: 2019-11-12 — End: 2019-12-15

## 2019-11-12 NOTE — Telephone Encounter (Signed)
#  [phone] caregiver, Colin Mulders states pt's ED medication is not working and he would like to get Viagra again, but there has to be specifics in order for pt to get medication as a certain price.    Please call caregiver as she has all the information needed.

## 2019-11-17 ENCOUNTER — Ambulatory Visit: Payer: PRIVATE HEALTH INSURANCE | Attending: Internal Medicine | Primary: Internal Medicine

## 2019-11-19 NOTE — Telephone Encounter (Signed)
Received phone call from patient's caregiver.    ID verified with Name and DOB.    Caregiver states that patient was recently seen by provider for a dog scratch to the left leg. Patient prescribed Bactrim at the time of his visit. Caregiver states that she has not noticed any improvement to left leg after completing antibiotic treatment. States that leg is still swollen, red, and releasing drainage at this time. Caregiver states that she was informed by provider to contact office if condition did not improve and would like to know what the next steps in treatment are at this time.

## 2019-12-02 ENCOUNTER — Encounter

## 2019-12-02 MED ORDER — PREDNISONE 20 MG TAB
20 mg | ORAL_TABLET | Freq: Every day | ORAL | 0 refills | Status: DC
Start: 2019-12-02 — End: 2019-12-15

## 2019-12-02 NOTE — Telephone Encounter (Signed)
Telephone Encounter by Elissa Hefty, MD at 12/02/19 1256                Author: Elissa Hefty, MD  Service: --  Author Type: Physician       Filed: 12/02/19 1256  Encounter Date: 12/02/2019  Status: Signed          Editor: Elissa Hefty, MD (Physician)          From: Gayleen OremEarvin Hansen TaylorSent: 12/02/2019  8:24 AM EDTSubject: note from providerPlease see note below. You have been on antibiotics frequently  in past 2 months Do you  have a fever? Productive cough? How often using inhaler? Thank you, Claris Gladden, CMA, [employer] [facility] [address],  [address], [address]    [place], [place]   [address]C: [phone]   F: [phone]

## 2019-12-08 ENCOUNTER — Ambulatory Visit: Payer: PRIVATE HEALTH INSURANCE | Attending: Internal Medicine | Primary: Internal Medicine

## 2019-12-15 ENCOUNTER — Telehealth: Attending: Internal Medicine | Primary: Internal Medicine

## 2019-12-15 ENCOUNTER — Telehealth: Admit: 2019-12-15 | Payer: PRIVATE HEALTH INSURANCE | Attending: Internal Medicine | Primary: Internal Medicine

## 2019-12-15 DIAGNOSIS — Z87891 Personal history of nicotine dependence: Secondary | ICD-10-CM

## 2019-12-15 MED ORDER — PANTOPRAZOLE 40 MG TAB, DELAYED RELEASE
40 mg | ORAL_TABLET | Freq: Two times a day (BID) | ORAL | 1 refills | Status: DC
Start: 2019-12-15 — End: 2021-03-16

## 2019-12-15 MED ORDER — SILDENAFIL 50 MG TAB
50 mg | ORAL_TABLET | Freq: Every day | ORAL | 1 refills | Status: DC | PRN
Start: 2019-12-15 — End: 2020-04-05

## 2019-12-15 MED ORDER — MIRTAZAPINE 30 MG TAB
30 mg | ORAL_TABLET | Freq: Every evening | ORAL | 1 refills | Status: DC
Start: 2019-12-15 — End: 2020-06-30

## 2019-12-15 MED ORDER — PREGABALIN 100 MG CAP
100 mg | ORAL_CAPSULE | Freq: Two times a day (BID) | ORAL | 1 refills | Status: DC
Start: 2019-12-15 — End: 2020-08-15

## 2019-12-15 MED ORDER — MONTELUKAST 10 MG TAB
10 mg | ORAL_TABLET | Freq: Every day | ORAL | 1 refills | Status: DC
Start: 2019-12-15 — End: 2021-03-27

## 2019-12-15 MED ORDER — ALBUTEROL SULFATE 90 MCG/ACTUATION BREATH ACTIVATED POWDER INHALER
90 mcg/actuation | Freq: Four times a day (QID) | RESPIRATORY_TRACT | 1 refills | Status: AC | PRN
Start: 2019-12-15 — End: ?

## 2019-12-15 MED ORDER — HYDROCHLOROTHIAZIDE 12.5 MG TAB
12.5 mg | ORAL_TABLET | Freq: Every day | ORAL | 1 refills | Status: DC
Start: 2019-12-15 — End: 2020-06-30

## 2019-12-15 MED ORDER — LIDOCAINE 5 % (700 MG/PATCH) ADHESIVE PATCH
5 % | MEDICATED_PATCH | CUTANEOUS | 1 refills | Status: DC
Start: 2019-12-15 — End: 2020-02-19

## 2019-12-15 MED ORDER — AMLODIPINE 10 MG TAB
10 mg | ORAL_TABLET | Freq: Every day | ORAL | 1 refills | Status: DC
Start: 2019-12-15 — End: 2020-06-30
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
## 2019-12-23 ENCOUNTER — Ambulatory Visit: Payer: PRIVATE HEALTH INSURANCE | Primary: Internal Medicine

## 2019-12-23 NOTE — Telephone Encounter (Signed)
PA started on Pantoprazole Sodium 40mg   Contact plan to follow up on [id number]

## 2019-12-30 NOTE — Telephone Encounter (Signed)
PA for Pantoprazole Sodium 40mg  tablet was approved.   Signed By: , LPN     December 30, 2019

## 2020-01-01 ENCOUNTER — Encounter: Payer: PRIVATE HEALTH INSURANCE | Attending: Internal Medicine | Primary: Internal Medicine

## 2020-01-01 ENCOUNTER — Ambulatory Visit: Payer: PRIVATE HEALTH INSURANCE | Primary: Internal Medicine

## 2020-02-19 MED ORDER — LIDOCAINE 5 % (700 MG/PATCH) ADHESIVE PATCH
5 % | MEDICATED_PATCH | CUTANEOUS | 1 refills | Status: DC
Start: 2020-02-19 — End: 2020-08-15

## 2020-04-05 ENCOUNTER — Encounter

## 2020-04-05 MED ORDER — SILDENAFIL 50 MG TAB
50 mg | ORAL_TABLET | Freq: Every day | ORAL | 1 refills | Status: DC | PRN
Start: 2020-04-05 — End: 2020-07-04

## 2020-04-05 MED ORDER — ALBUTEROL SULFATE HFA 90 MCG/ACTUATION AEROSOL INHALER
90 mcg/actuation | RESPIRATORY_TRACT | 11 refills | Status: AC
Start: 2020-04-05 — End: ?

## 2020-04-05 NOTE — Telephone Encounter (Signed)
-----   Message from Carolanne Grumbling sent at 04/05/2020 10:50 AM EST -----  Regarding: Prescription refill   Hello Doc was reaching out to you for a refill for my viagra 50mg  30day supply can you send it to the [facility] neighborhood [facility] on [address]. thanks

## 2020-04-05 NOTE — Telephone Encounter (Signed)
 Future Appointments:  No future appointments.     Last Appointment With Me:  12/15/2019     Requested Prescriptions     Pending Prescriptions Disp Refills   . albuterol  (PROVENTIL  HFA, VENTOLIN  HFA, PROAIR  HFA) 90 mcg/actuation inhaler [Pharmacy Med Name: ALBUTEROL  HFA (PROVENTIL ) INH] 6.7 Each 11     Sig: INHALE 2 PUFFS BY MOUTH EVERY 4 HOURS AS NEEDED FOR WHEEZE

## 2020-04-05 NOTE — Telephone Encounter (Signed)
Future Appointments:  No future appointments.     Last Appointment With Me:  12/15/2019     Requested Prescriptions     Pending Prescriptions Disp Refills   . sildenafil citrate (Viagra) 50 mg tablet 30 Tablet 1     Sig: Take 1 Tablet by mouth daily as needed for Erectile Dysfunction.

## 2020-05-04 ENCOUNTER — Encounter

## 2020-05-04 MED ORDER — ALBUTEROL SULFATE 0.083 % (0.83 MG/ML) SOLN FOR INHALATION
2.5 mg /3 mL (0.083 %) | Freq: Four times a day (QID) | RESPIRATORY_TRACT | 2 refills | Status: DC | PRN
Start: 2020-05-04 — End: 2021-03-27

## 2020-05-12 ENCOUNTER — Encounter

## 2020-05-12 MED ORDER — AMOXICILLIN CLAVULANATE 875 MG-125 MG TAB
875-125 mg | ORAL_TABLET | Freq: Two times a day (BID) | ORAL | 0 refills | Status: AC
Start: 2020-05-12 — End: 2021-03-16

## 2020-05-12 MED ORDER — LEVOCETIRIZINE 5 MG TAB
5 mg | ORAL_TABLET | Freq: Every evening | ORAL | 1 refills | Status: DC
Start: 2020-05-12 — End: 2021-06-01

## 2020-05-12 NOTE — Telephone Encounter (Signed)
Telephone Encounter by Elissa Hefty, MD at 05/12/20 1427                Author: Elissa Hefty, MD  Service: --  Author Type: Physician       Filed: 05/12/20 1427  Encounter Date: 05/12/2020  Status: Signed          Editor: Elissa Hefty, MD (Physician)          Pamalee Leyden 05/12/2020  1:33 PM EDT----- Message -----From: Earvin Hansen TaylorSent: 05/12/2020   1:23 PM EDTTo: [name] Nurse PoolSubject: Allergies an  abscess                         Hello Dr I am writing to see if I could possibly get something for my allergies this pollen is killing me runny eyes an sneezing I brought some allergy eye drops for itching an comfort but it's not working. And also I have  an abscess on my top front gum area [place] a broken tooth I'm currently waiting for dentist appointment it isn't until may 27th [employer]. All I'm asking is for something help get rid of infection that's all please an thanks have a great day.Marland Kitchen

## 2020-06-13 ENCOUNTER — Encounter: Payer: PRIVATE HEALTH INSURANCE | Attending: Internal Medicine | Primary: Internal Medicine

## 2020-06-13 ENCOUNTER — Ambulatory Visit: Payer: PRIVATE HEALTH INSURANCE | Attending: Internal Medicine | Primary: Internal Medicine

## 2020-06-30 ENCOUNTER — Encounter

## 2020-06-30 MED ORDER — AMLODIPINE 10 MG TAB
10 mg | ORAL_TABLET | ORAL | 1 refills | Status: AC
Start: 2020-06-30 — End: 2021-03-16

## 2020-06-30 MED ORDER — MIRTAZAPINE 30 MG TAB
30 mg | ORAL_TABLET | ORAL | 1 refills | Status: AC
Start: 2020-06-30 — End: ?

## 2020-06-30 MED ORDER — HYDROCHLOROTHIAZIDE 12.5 MG TAB
12.5 mg | ORAL_TABLET | ORAL | 1 refills | Status: DC
Start: 2020-06-30 — End: 2021-06-01

## 2020-07-04 MED ORDER — SILDENAFIL 100 MG TAB
100 mg | ORAL_TABLET | Freq: Every day | ORAL | 1 refills | Status: DC | PRN
Start: 2020-07-04 — End: 2020-07-15

## 2020-07-04 NOTE — Telephone Encounter (Signed)
-----   Message from John Duke sent at 07/03/2020  2:10 PM EDT -----  Regarding: Viagra refills out  Hey Dr appa.  I'm reaching out about my viagra that you have me on right now I'm on 50mg  but I'm having to take 2 at once because one isn't working. Can you send my in a 30 days of 100mg  please so I can stop running out so fast taking 2 pills. Thanks alot Dr an could it be sent to the [facility] neighborhood [facility] [address]. Have great day see you soon.

## 2020-07-15 MED ORDER — SILDENAFIL 100 MG TAB
100 mg | ORAL_TABLET | Freq: Every day | ORAL | 1 refills | Status: DC | PRN
Start: 2020-07-15 — End: 2020-10-04

## 2020-07-15 NOTE — Telephone Encounter (Signed)
Telephone Encounter by Elissa Hefty, MD at 07/15/20 1408                Author: Elissa Hefty, MD  Service: --  Author Type: Physician       Filed: 07/15/20 1408  Encounter Date: 07/15/2020  Status: Signed          Editor: Elissa Hefty, MD (Physician)          John Duke 07/15/2020  1:50 PM EDT----- Message -----From: [name]: 07/15/2020  12:39 PM EDTTo: [name] Nurse PoolSubject: Viagra script  100mg                           Hey Dr appa thanks for the increase on my medication.  When I went to pick them up it was a qty of 7 [facility] 7 but for qty of 30 it's $20.87. I'm seeing if you can call in the month supply because  15 every script is kind of to much for 7 when I can pay 20 for a month . Thanks alot an I'm also scheduling to come in an see you.

## 2020-08-02 ENCOUNTER — Ambulatory Visit: Payer: PRIVATE HEALTH INSURANCE | Attending: Internal Medicine | Primary: Internal Medicine

## 2020-08-14 ENCOUNTER — Encounter

## 2020-08-15 MED ORDER — PREGABALIN 100 MG CAP
100 mg | ORAL_CAPSULE | Freq: Two times a day (BID) | ORAL | 2 refills | Status: DC
Start: 2020-08-15 — End: 2021-03-16

## 2020-08-15 MED ORDER — LIDOCAINE 5 % (700 MG/PATCH) ADHESIVE PATCH
5 % | MEDICATED_PATCH | CUTANEOUS | 1 refills | Status: DC
Start: 2020-08-15 — End: 2021-03-27

## 2020-09-27 ENCOUNTER — Ambulatory Visit: Payer: PRIVATE HEALTH INSURANCE | Attending: Internal Medicine | Primary: Internal Medicine

## 2020-10-04 MED ORDER — SILDENAFIL 100 MG TAB
100 mg | ORAL_TABLET | ORAL | 3 refills | Status: AC
Start: 2020-10-04 — End: 2021-05-01

## 2020-10-05 NOTE — Telephone Encounter (Signed)
He has no showed his last 3 appointments.  Please advise

## 2020-10-27 ENCOUNTER — Ambulatory Visit: Payer: PRIVATE HEALTH INSURANCE | Attending: Internal Medicine | Primary: Internal Medicine

## 2021-02-27 ENCOUNTER — Encounter
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
## 2021-03-10 ENCOUNTER — Encounter: Payer: PRIVATE HEALTH INSURANCE | Attending: Internal Medicine

## 2021-03-10 NOTE — Telephone Encounter (Signed)
Telephone  Encounter by Nuala Alpha at 03/10/21 [id number]                Author: Nuala Alpha  Service: --  Author Type: --       Filed: 03/10/21 0819  Encounter Date: 03/10/2021  Status: Signed          Editor: Nuala Alpha               Patient called again at 8:15 and states transportation wont arrive in time   Pt confirms aware it is considered a no show bc he chose not to cancel prior to appt time.      States can he please be worked in for  a new appt?         Please call pt

## 2021-03-10 NOTE — Telephone Encounter (Signed)
Telephone  Encounter by Nuala Alpha at 03/10/21 [id number]                Author: Nuala Alpha  Service: --  Author Type: --       Filed: 03/10/21 0815  Encounter Date: 03/10/2021  Status: Signed          Editor: Nuala Alpha               Pt called states transportation left without him and they are on their way back to get thim      Caller advised of grace period of 15 min otherwise needs to r/s      Advised that if appt not cancelled before 8:15 and he does not make it, that it would be considered a no show      Pt states don't cancel yet, he is checking on driver and will call back.

## 2021-03-10 NOTE — Telephone Encounter (Signed)
Telephone  Encounter by Adela Lank at 03/10/21 1610                Author: Adela Lank  Service: --  Author Type: --       Filed: 03/10/21 0826  Encounter Date: 03/10/2021  Status: Signed          Editor: Adela Lank               R/s For [employer]

## 2021-03-16 ENCOUNTER — Ambulatory Visit: Attending: Internal Medicine | Primary: Internal Medicine

## 2021-03-16 ENCOUNTER — Ambulatory Visit: Admit: 2021-03-16 | Payer: MEDICAID | Attending: Internal Medicine | Primary: Internal Medicine

## 2021-03-16 DIAGNOSIS — I10 Essential (primary) hypertension: Secondary | ICD-10-CM

## 2021-03-16 MED ORDER — POTASSIUM CHLORIDE SR 20 MEQ TAB, PARTICLES/CRYSTALS
20 mEq | ORAL_TABLET | Freq: Every day | ORAL | 1 refills | Status: AC
Start: 2021-03-16 — End: ?

## 2021-03-16 MED ORDER — PANTOPRAZOLE 40 MG TAB, DELAYED RELEASE
40 mg | ORAL_TABLET | Freq: Two times a day (BID) | ORAL | 1 refills | Status: AC
Start: 2021-03-16 — End: ?

## 2021-03-16 MED ORDER — FUROSEMIDE 40 MG TAB
40 mg | ORAL_TABLET | Freq: Every day | ORAL | 1 refills | Status: DC
Start: 2021-03-16 — End: 2021-04-09

## 2021-03-16 NOTE — Progress Notes (Signed)
 Rm    Chief Complaint   Patient presents with    Foot Swelling     Both starting about 23/23    Leg Injury     Shattered tabula and broke fibula         Visit Vitals  BP 124/79   Pulse 89   Temp 97.5 F (36.4 C) (Temporal)   Resp 20   Ht 6' (1.829 m)   Wt 278 lb (126.1 kg)   SpO2 97%   BMI 37.70 kg/m        1. Have you been to the ER, urgent care clinic since your last visit?  Hospitalized since your last visit?No    2. Have you seen or consulted any other health care providers outside of the [facility] since your last visit?  Include any pap smears or colon screening. No     Health Maintenance Due   Topic Date Due    COVID-19 Vaccine (1) Never done    Pneumococcal 0-64 years (2 - PPSV23 if available, else PCV20) 03/10/2016    Flu Vaccine (1) 08/29/2020    Depression Screen  12/14/2020        3 most recent PHQ Screens 03/16/2021   Little interest or pleasure in doing things Not at all   Feeling down, depressed, irritable, or hopeless Not at all   Total Score PHQ 2 0        No flowsheet data found.    Learning Assessment 03/03/2015   PRIMARY LEARNER Patient   CO-LEARNER HIGHEST LEVEL OF EDUCATION DID NOT GRADUATE HIGH SCHOOL   BARRIERS CO-LEARNER NONE   PRIMARY LANGUAGE ENGLISH   PRIMARY LANGUAGE CO-LEARNER ENGLISH   LEARNER PREFERENCE PRIMARY LISTENING   ANSWERED BY patient   RELATIONSHIP SELF
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
## 2021-03-16 NOTE — Addendum Note (Signed)
 Addendum  Note by Pherson, Melissa at 03/16/21 1115                Author: Pherson, Melissa  Service: --  Author Type: Technician       Filed: 03/27/21 1505  Encounter Date: 03/16/2021  Status: Signed          Editor: Pherson, Melissa (Technician)          Addended by: VICKEY SETTER on: 03/27/2021 03:05 PM    Modules accepted: Orders

## 2021-03-20 ENCOUNTER — Encounter

## 2021-03-22 NOTE — Telephone Encounter (Signed)
Telephone Encounter by Elissa Hefty, MD at 03/22/21 1427                Author: Elissa Hefty, MD  Service: --  Author Type: Physician       Filed: 03/22/21 1427  Encounter Date: 03/20/2021  Status: Signed          Editor: Elissa Hefty, MD (Physician)          John Duke 03/20/2021 11:28 AM EST----- Message -----From: [name]: 03/20/2021  11:19 AM ESTTo: [facility] Nurse PoolSubject: Medication                                    Hey Dr I was so in pain the other day because I was swollen I honestly forgot to talk to you about some prescriptions that I can only get from you for medical equipmen. I need a prescription for a walking cane an one for  a shower chair. Also could I get one for a walker that has the seat built in it because it's time where I get tired an there's no where to sit at an I honestly don't want to go back to a wheelchair. Also would it be a good idea to use circulation stockings?  If so could I get a prescription for those as well. My medicaid only will pay if I have a prescription for these items. I have been coughing up an blowing out green an yellow cold as well is it possible to call in a antibiotic or something to help break  this yucky stuff up please. I haven't been running a fever but I have been feeling on the more down side for last few days due to having to go out of house with only slippers on because of swelling. Thanks alot for helping me out Dr.Appa Falcao .

## 2021-03-27 ENCOUNTER — Encounter

## 2021-03-27 ENCOUNTER — Inpatient Hospital Stay: Admit: 2021-03-27 | Payer: MEDICAID | Attending: Internal Medicine | Primary: Internal Medicine

## 2021-03-27 DIAGNOSIS — R600 Localized edema: Secondary | ICD-10-CM

## 2021-03-27 LAB — ECHO ADULT COMPLETE
AV Area by Peak Velocity: 3.7 cm2
AV Area by VTI: 3.2 cm2
AV Mean Gradient: 6 mmHg
AV Mean Velocity: 1.2 m/s
AV Peak Gradient: 11 mmHg
AV Peak Velocity: 1.6 m/s
AV VTI: 36.5 cm
AV Velocity Ratio: 0.88
AVA/BSA Peak Velocity: 1.5 cm2/m2
AVA/BSA VTI: 1.3 cm2/m2
E/E' Lateral: 6.88
E/E' Ratio (Averaged): 7.67
E/E' Septal: 8.46
Fractional Shortening 2D: 37 % (ref 28–44)
IVSd: 1.1 cm — AB (ref 0.6–1.0)
LA Volume 2C: 51 mL (ref 18–58)
LA Volume 4C: 57 mL (ref 18–58)
LA Volume A/L: 60 mL
LA Volume Index 2C: 21 mL/m2 (ref 16–34)
LA Volume Index 4C: 23 mL/m2 (ref 16–34)
LA Volume Index A/L: 24 mL/m2 (ref 16–34)
LV E' Lateral Velocity: 16 cm/s
LV E' Septal Velocity: 13 cm/s
LV Mass 2D Index: 90.1 g/m2 (ref 49–115)
LV Mass 2D: 220.8 g (ref 88–224)
LV RWT Ratio: 0.42
LVIDd Index: 2.12 cm/m2
LVIDd: 5.2 cm (ref 4.2–5.9)
LVIDs Index: 1.35 cm/m2
LVIDs: 3.3 cm
LVOT Area: 4.2 cm2
LVOT Diameter: 2.3 cm
LVOT Mean Gradient: 5 mmHg
LVOT Peak Gradient: 8 mmHg
LVOT Peak Velocity: 1.4 m/s
LVOT SV: 115 ml
LVOT Stroke Volume Index: 47 mL/m2
LVOT VTI: 27.7 cm
LVOT:AV VTI Index: 0.76
LVPWd: 1.1 cm — AB (ref 0.6–1.0)
MV A Velocity: 0.75 m/s
MV E Velocity: 1.1 m/s
MV E Wave Deceleration Time: 218.4 ms
MV E/A: 1.47
PV Max Velocity: 1 m/s
PV Peak Gradient: 4 mmHg
Pulm Vein Peak D Velocity: 0.8 m/s
Pulm Vein Peak S Velocity: 0.8 m/s
Pulm Vein S/D: 1 no units
RVOT Area: 11.3 cm2
RVOT Diameter: 3.8 cm
TAPSE: 2.1 cm (ref 1.7–?)
TR Max Velocity: 2.79 m/s
TR Peak Gradient: 31 mmHg

## 2021-03-27 LAB — ECHO (TTE) COMPLETE (PRN CONTRAST/BUBBLE/STRAIN/3D)
AV Area by Peak Velocity: 3.7 cm2
AV Area by VTI: 3.2 cm2
AV Mean Gradient: 6 mmHg
AV Mean Velocity: 1.2 m/s
AV Peak Gradient: 11 mmHg
AV Peak Velocity: 1.6 m/s
AV VTI: 36.5 cm
AV Velocity Ratio: 0.88
AVA/BSA Peak Velocity: 1.5 cm2/m2
AVA/BSA VTI: 1.3 cm2/m2
E/E' Lateral: 6.88
E/E' Ratio (Averaged): 7.67
E/E' Septal: 8.46
Fractional Shortening 2D: 37 % (ref 28–44)
IVSd: 1.1 cm — AB (ref 0.6–1)
LA Volume A-L A4C: 51 mL (ref 18–58)
LA Volume A-L A4C: 57 mL (ref 18–58)
LA Volume A/L: 60 mL
LA Volume Index A-L A2C: 21 mL/m2 (ref 16–34)
LA Volume Index A-L A4C: 23 mL/m2 (ref 16–34)
LA Volume Index A/L: 24 mL/m2 (ref 16–34)
LV E' Lateral Velocity: 16 cm/s
LV E' Septal Velocity: 13 cm/s
LV Mass 2D Index: 90.1 g/m2 (ref 49–115)
LV Mass 2D: 220.8 g (ref 88–224)
LV RWT Ratio: 0.42
LVIDd Index: 2.12 cm/m2
LVIDd: 5.2 cm (ref 4.2–5.9)
LVIDs Index: 1.35 cm/m2
LVIDs: 3.3 cm
LVOT Area: 4.2 cm2
LVOT Diameter: 2.3 cm
LVOT Mean Gradient: 5 mmHg
LVOT Peak Gradient: 8 mmHg
LVOT Peak Velocity: 1.4 m/s
LVOT SV: 115 ml
LVOT Stroke Volume Index: 47 mL/m2
LVOT VTI: 27.7 cm
LVOT:AV VTI Index: 0.76
LVPWd: 1.1 cm — AB (ref 0.6–1)
Left Ventricular Ejection Fraction: 58
MV A Velocity: 0.75 m/s
MV E Velocity: 1.1 m/s
MV E Wave Deceleration Time: 218.4 ms
MV E/A: 1.47
PV Max Velocity: 1 m/s
PV Peak Gradient: 4 mmHg
Pulm Vein Peak D Velocity: 0.8 m/s
Pulm Vein Peak S Velocity: 0.8 m/s
Pulm Vein S/D: 1 no units
RVOT Area: 11.3 cm2
RVOT Diameter: 3.8 cm
TAPSE: 2.1 cm
TR Max Velocity: 2.79 m/s
TR Peak Gradient: 31 mmHg

## 2021-03-27 MED ORDER — LIDOCAINE 5 % (700 MG/PATCH) ADHESIVE PATCH
5 % | MEDICATED_PATCH | CUTANEOUS | 1 refills | Status: AC
Start: 2021-03-27 — End: ?

## 2021-03-27 MED ORDER — ALBUTEROL SULFATE 0.083 % (0.83 MG/ML) SOLN FOR INHALATION
2.5 mg /3 mL (0.083 %) | RESPIRATORY_TRACT | 2 refills | Status: AC
Start: 2021-03-27 — End: ?

## 2021-03-27 MED ORDER — MONTELUKAST 10 MG TAB
10 mg | ORAL_TABLET | ORAL | 1 refills | Status: AC
Start: 2021-03-27 — End: ?

## 2021-03-27 MED ORDER — PREGABALIN 100 MG CAP
100 mg | ORAL_CAPSULE | Freq: Two times a day (BID) | ORAL | 1 refills | Status: AC
Start: 2021-03-27 — End: ?

## 2021-03-27 NOTE — Progress Notes (Signed)
Discussed mild dilatation of right ventricle and recommend sleep study

## 2021-03-27 NOTE — Progress Notes (Signed)
Discussed results with patient   Recommend sleep study

## 2021-03-28 ENCOUNTER — Encounter

## 2021-03-28 LAB — METABOLIC PANEL, COMPREHENSIVE
A-G Ratio: 0.9 — ABNORMAL LOW (ref 1.1–2.2)
ALT (SGPT): 31 U/L (ref 12–78)
AST (SGOT): 24 U/L (ref 15–37)
Albumin: 3.8 g/dL (ref 3.5–5.0)
Alk. phosphatase: 118 U/L — ABNORMAL HIGH (ref 45–117)
Anion gap: 2 mmol/L — ABNORMAL LOW (ref 5–15)
BUN/Creatinine ratio: 14 (ref 12–20)
BUN: 14 MG/DL (ref 6–20)
Bilirubin, total: 0.3 MG/DL (ref 0.2–1.0)
CO2: 35 mmol/L — ABNORMAL HIGH (ref 21–32)
Calcium: 9.6 MG/DL (ref 8.5–10.1)
Chloride: 100 mmol/L (ref 97–108)
Creatinine: 1.02 MG/DL (ref 0.70–1.30)
Globulin: 4.3 g/dL — ABNORMAL HIGH (ref 2.0–4.0)
Glucose: 105 mg/dL — ABNORMAL HIGH (ref 65–100)
Potassium: 4.5 mmol/L (ref 3.5–5.1)
Protein, total: 8.1 g/dL (ref 6.4–8.2)
Sodium: 137 mmol/L (ref 136–145)
eGFR: >60 mL/min/{1.73_m2} (ref >60)

## 2021-03-28 LAB — CBC WITH AUTOMATED DIFF
ABS. BASOPHILS: 0.1 10*3/uL (ref 0.0–0.1)
ABS. EOSINOPHILS: 0.3 10*3/uL (ref 0.0–0.4)
ABS. IMM. GRANS.: 0 10*3/uL (ref 0.00–0.04)
ABS. LYMPHOCYTES: 1.9 10*3/uL (ref 0.8–3.5)
ABS. MONOCYTES: 0.4 10*3/uL (ref 0.0–1.0)
ABS. NEUTROPHILS: 4.2 10*3/uL (ref 1.8–8.0)
ABSOLUTE NRBC: 0 10*3/uL (ref 0.00–0.01)
BASOPHILS: 1 % (ref 0–1)
EOSINOPHILS: 4 % (ref 0–7)
HCT: 40.5 % (ref 36.6–50.3)
HGB: 12.8 g/dL (ref 12.1–17.0)
IMMATURE GRANULOCYTES: 0 % (ref 0.0–0.5)
LYMPHOCYTES: 27 % (ref 12–49)
MCH: 26.1 PG (ref 26.0–34.0)
MCHC: 31.6 g/dL (ref 30.0–36.5)
MCV: 82.5 FL (ref 80.0–99.0)
MONOCYTES: 6 % (ref 5–13)
MPV: 10 FL (ref 8.9–12.9)
NEUTROPHILS: 62 % (ref 32–75)
NRBC: 0 PER 100 WBC
PLATELET: 423 10*3/uL — ABNORMAL HIGH (ref 150–400)
RBC: 4.91 M/uL (ref 4.10–5.70)
RDW: 13.4 % (ref 11.5–14.5)
WBC: 6.8 10*3/uL (ref 4.1–11.1)

## 2021-03-28 LAB — URINALYSIS W/ RFLX MICROSCOPIC
BACTERIA, URINE: NEGATIVE /hpf
Bacteria: NEGATIVE /hpf
Bilirubin, Urine: NEGATIVE
Bilirubin: NEGATIVE
Blood, Urine: NEGATIVE
Blood: NEGATIVE
Glucose, Ur: NEGATIVE mg/dL
Glucose: NEGATIVE mg/dL
Ketone: NEGATIVE mg/dL
Ketones, Urine: NEGATIVE mg/dL
Leukocyte Esterase, Urine: NEGATIVE
Leukocyte Esterase: NEGATIVE
Nitrite, Urine: NEGATIVE
Nitrites: NEGATIVE
Specific Gravity, UA: 1.024 (ref 1.003–1.030)
Specific gravity: 1.024 (ref 1.003–1.030)
Urobilinogen, UA, POCT: 1 EU/dL (ref 0.2–1.0)
Urobilinogen: 1 EU/dL (ref 0.2–1.0)
pH (UA): 6.5 (ref 5.0–8.0)
pH, UA: 6.5 (ref 5.0–8.0)

## 2021-03-28 LAB — NT-PRO BNP: NT pro-BNP: 64 PG/ML (ref <125)

## 2021-03-28 LAB — TSH 3RD GENERATION
TSH: 1.87 u[IU]/mL (ref 0.36–3.74)
TSH: 1.87 u[IU]/mL (ref 0.36–3.74)

## 2021-03-28 LAB — COMPREHENSIVE METABOLIC PANEL
ALT: 31 U/L (ref 12–78)
AST: 24 U/L (ref 15–37)
Albumin/Globulin Ratio: 0.9 — ABNORMAL LOW (ref 1.1–2.2)
Albumin: 3.8 g/dL (ref 3.5–5.0)
Alkaline Phosphatase: 118 U/L — ABNORMAL HIGH (ref 45–117)
Anion Gap: 2 mmol/L — ABNORMAL LOW (ref 5–15)
BUN: 14 MG/DL (ref 6–20)
Bun/Cre Ratio: 14 (ref 12–20)
CO2: 35 mmol/L — ABNORMAL HIGH (ref 21–32)
Calcium: 9.6 MG/DL (ref 8.5–10.1)
Chloride: 100 mmol/L (ref 97–108)
Creatinine: 1.02 MG/DL (ref 0.70–1.30)
ESTIMATED GLOMERULAR FILTRATION RATE: >60 mL/min/{1.73_m2} (ref >60)
Globulin: 4.3 g/dL — ABNORMAL HIGH (ref 2.0–4.0)
Glucose: 105 mg/dL — ABNORMAL HIGH (ref 65–100)
Potassium: 4.5 mmol/L (ref 3.5–5.1)
Sodium: 137 mmol/L (ref 136–145)
Total Bilirubin: 0.3 MG/DL (ref 0.2–1.0)
Total Protein: 8.1 g/dL (ref 6.4–8.2)

## 2021-03-28 LAB — CBC WITH AUTO DIFFERENTIAL
Basophils %: 1 % (ref 0–1)
Basophils Absolute: 0.1 10*3/uL (ref 0.0–0.1)
Eosinophils %: 4 % (ref 0–7)
Eosinophils Absolute: 0.3 10*3/uL (ref 0.0–0.4)
Granulocyte Absolute Count: 0 10*3/uL (ref 0.00–0.04)
Hematocrit: 40.5 % (ref 36.6–50.3)
Hemoglobin: 12.8 g/dL (ref 12.1–17.0)
Immature Granulocytes: 0 % (ref 0.0–0.5)
Lymphocytes %: 27 % (ref 12–49)
Lymphocytes Absolute: 1.9 10*3/uL (ref 0.8–3.5)
MCH: 26.1 PG (ref 26.0–34.0)
MCHC: 31.6 g/dL (ref 30.0–36.5)
MCV: 82.5 FL (ref 80.0–99.0)
MPV: 10 FL (ref 8.9–12.9)
Monocytes %: 6 % (ref 5–13)
Monocytes Absolute: 0.4 10*3/uL (ref 0.0–1.0)
NRBC Absolute: 0 10*3/uL (ref 0.00–0.01)
Neutrophils %: 62 % (ref 32–75)
Neutrophils Absolute: 4.2 10*3/uL (ref 1.8–8.0)
Nucleated RBCs: 0 PER 100 WBC
Platelets: 423 10*3/uL — ABNORMAL HIGH (ref 150–400)
RBC: 4.91 M/uL (ref 4.10–5.70)
RDW: 13.4 % (ref 11.5–14.5)
WBC: 6.8 10*3/uL (ref 4.1–11.1)

## 2021-03-28 LAB — PROBNP, N-TERMINAL: BNP: 64 PG/ML (ref <125)

## 2021-03-28 NOTE — Telephone Encounter (Signed)
Telephone Encounter by Elissa Hefty, MD at 03/28/21 1630                Author: Elissa Hefty, MD  Service: --  Author Type: Physician       Filed: 03/28/21 1630  Encounter Date: 03/28/2021  Status: Signed          Editor: Elissa Hefty, MD (Physician)          Pamalee Leyden 03/28/2021  3:51 PM EST----- Message -----From: [name]: 03/28/2021   3:43 PM ESTTo: [facility] Nurse PoolSubject: Lab results                                   Hey again Dr appa sorry to keep reaching out to you my anxiety and PTSD is all over the place right now because I don't understand how to read the lab results l.o.l. can you please let me know briefly if everything is  ok . Thanks alot and have a blessed rest of your day.

## 2021-03-30 MED ORDER — AMOXICILLIN CLAVULANATE 875 MG-125 MG TAB
875-125 mg | ORAL_TABLET | Freq: Two times a day (BID) | ORAL | 0 refills | Status: AC
Start: 2021-03-30 — End: ?

## 2021-03-30 NOTE — Telephone Encounter (Signed)
Telephone Encounter by Elissa Hefty, MD at 03/30/21 [phone]                Author: Elissa Hefty, MD  Service: --  Author Type: Physician       Filed: 03/30/21 0849  Encounter Date: 03/28/2021  Status: Signed          Editor: Elissa Hefty, MD (Physician)          John Duke 03/30/2021  7:48 AM EST----- Message -----From: [name]: 03/29/2021   5:48 PM ESTTo: [facility] Nurse PoolSubject: Question regarding  ECHO ADULT COMPLETE       Hello Dr Vida Rigger was checking in with you about the antibiotic you said you was going to call in for me to help knock out this cold I have  I think you forgot to call it in for me I [facility]'t [facility]

## 2021-03-30 NOTE — Telephone Encounter (Signed)
Location of patient: [place]    Received call from [place] at [facility] with [employer].    Subjective: Caller states "saw doctor last week, has been seen for swelling, was told that doctor was going to call him in an antibiotic for green mucous" Patient just trying to get in touch with the office to see if the antibiotic. Writer called [facility], and spoke with Annetta. As Jonny Ruiz were speaking, the doctor placed the order for antibiotic. Patient was notified, no triage performed.    Reason for Disposition  . Caller has already spoken with another triager or PCP (or office), and has further questions and triager able to answer questions.    Protocols used: No Contact or Duplicate Contact Call-ADULT-[place]

## 2021-04-03 NOTE — Telephone Encounter (Signed)
 Patient has been scheduled for an appointment with Dr. Sherrilee on 04/04/21.

## 2021-04-03 NOTE — Telephone Encounter (Signed)
 Pt states that he has leg sores that are painful.  He has read up on this and states it could be poor circulation.  Pt states he was just put on a new water pill and would like to discuss.      Please call pt.  Thanks.

## 2021-04-04 ENCOUNTER — Telehealth: Attending: Family Medicine | Primary: Internal Medicine

## 2021-04-04 ENCOUNTER — Telehealth: Admit: 2021-04-04 | Payer: MEDICAID | Attending: Family Medicine | Primary: Internal Medicine

## 2021-04-04 DIAGNOSIS — L03119 Cellulitis of unspecified part of limb: Secondary | ICD-10-CM

## 2021-04-04 MED ORDER — TRIMETHOPRIM-SULFAMETHOXAZOLE 160 MG-800 MG TAB
160-800 mg | ORAL_TABLET | Freq: Two times a day (BID) | ORAL | 0 refills | Status: AC
Start: 2021-04-04 — End: 2021-06-01

## 2021-04-04 NOTE — Progress Notes (Signed)
Chief Complaint   Patient presents with    Leg Pain     Sores.          1. "Have you been to the ER, urgent care clinic since your last visit?  Hospitalized since your last visit?" No    2. "Have you seen or consulted any other health care providers outside of the [facility] since your last visit?" No     3. For patients aged 43-75: Has the patient had a colonoscopy / FIT/ Cologuard? NA - based on age      If the patient is male:    4. For patients aged 14-74: Has the patient had a mammogram within the past 2 years? NA - based on age or sex      27. For patients aged 21-65: Has the patient had a pap smear? NA - based on age or sex
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
## 2021-04-08 ENCOUNTER — Encounter

## 2021-04-09 NOTE — Telephone Encounter (Signed)
No need for potassium given last potassium level

## 2021-04-10 MED ORDER — FUROSEMIDE 40 MG TAB
40 mg | ORAL_TABLET | ORAL | 1 refills | Status: DC
Start: 2021-04-10 — End: 2021-05-07

## 2021-04-11 ENCOUNTER — Encounter: Attending: Internal Medicine | Primary: Internal Medicine

## 2021-04-14 ENCOUNTER — Ambulatory Visit: Admit: 2021-04-14 | Payer: MEDICAID | Attending: Internal Medicine | Primary: Internal Medicine

## 2021-04-14 DIAGNOSIS — L0100 Impetigo, unspecified: Secondary | ICD-10-CM

## 2021-04-14 MED ORDER — DOXYCYCLINE 100 MG TAB
100 mg | ORAL_TABLET | Freq: Two times a day (BID) | ORAL | 0 refills | Status: AC
Start: 2021-04-14 — End: 2021-06-01

## 2021-04-14 MED ORDER — MUPIROCIN 2 % OINTMENT
2 % | Freq: Every day | CUTANEOUS | 0 refills | Status: AC
Start: 2021-04-14 — End: 2021-05-12

## 2021-04-14 NOTE — Addendum Note (Signed)
 Addendum Note  by Cosme Hoose at 04/14/21 1415                Author: Cosme Hoose  Service: --  Author Type: Technician       Filed: 04/14/21 1521  Encounter Date: 04/14/2021  Status: Signed          Editor: Cosme Hoose (Technician)          Addended by: BULLOCK, JANET on: 04/14/2021 03:21 PM    Modules accepted: Orders

## 2021-04-14 NOTE — Progress Notes (Signed)
 Chief Complaint   Patient presents with    Leg Pain     Both          1. Have you been to the ER, urgent care clinic since your last visit?  Hospitalized since your last visit? No    2. Have you seen or consulted any other health care providers outside of the [facility] since your last visit? No     3. For patients aged 43-75: Has the patient had a colonoscopy / FIT/ Cologuard? NA - based on age      If the patient is male:    4. For patients aged 65-74: Has the patient had a mammogram within the past 2 years? NA - based on age or sex      22. For patients aged 21-65: Has the patient had a pap smear? NA - based on age or sex

## 2021-04-15 LAB — METABOLIC PANEL, BASIC
Anion gap: 5 mmol/L (ref 5–15)
BUN/Creatinine ratio: 15 (ref 12–20)
BUN: 14 MG/DL (ref 6–20)
CO2: 31 mmol/L (ref 21–32)
Calcium: 9.4 MG/DL (ref 8.5–10.1)
Chloride: 102 mmol/L (ref 97–108)
Creatinine: 0.94 MG/DL (ref 0.70–1.30)
Glucose: 95 mg/dL (ref 65–100)
Potassium: 4.3 mmol/L (ref 3.5–5.1)
Sodium: 138 mmol/L (ref 136–145)
eGFR: >60 mL/min/{1.73_m2} (ref >60)

## 2021-04-15 LAB — BASIC METABOLIC PANEL
Anion Gap: 5 mmol/L (ref 5–15)
BUN: 14 MG/DL (ref 6–20)
Bun/Cre Ratio: 15 (ref 12–20)
CO2: 31 mmol/L (ref 21–32)
Calcium: 9.4 MG/DL (ref 8.5–10.1)
Chloride: 102 mmol/L (ref 97–108)
Creatinine: 0.94 MG/DL (ref 0.70–1.30)
ESTIMATED GLOMERULAR FILTRATION RATE: >60 mL/min/{1.73_m2} (ref >60)
Glucose: 95 mg/dL (ref 65–100)
Potassium: 4.3 mmol/L (ref 3.5–5.1)
Sodium: 138 mmol/L (ref 136–145)

## 2021-04-17 ENCOUNTER — Encounter: Attending: Internal Medicine | Primary: Internal Medicine

## 2021-04-18 ENCOUNTER — Encounter: Attending: Internal Medicine | Primary: Internal Medicine

## 2021-05-01 ENCOUNTER — Encounter

## 2021-05-02 ENCOUNTER — Ambulatory Visit: Payer: MEDICARE | Attending: Internal Medicine | Primary: Internal Medicine

## 2021-05-02 MED ORDER — SILDENAFIL 100 MG TAB
100 mg | ORAL_TABLET | ORAL | 3 refills | Status: AC | PRN
Start: 2021-05-02 — End: ?

## 2021-05-02 NOTE — Telephone Encounter (Signed)
Future Appointments:  Future Appointments   Date Time Provider Department Center   05/02/2021  9:20 AM Lincoln Brigham, MD [facility] BS AMB        Last Appointment With Me:  04/14/2021     Requested Prescriptions     Pending Prescriptions Disp Refills    sildenafil citrate (VIAGRA) 100 mg tablet 30 Tablet 3

## 2021-05-07 MED ORDER — FUROSEMIDE 40 MG TAB
40 mg | ORAL_TABLET | ORAL | 1 refills | Status: DC
Start: 2021-05-07 — End: 2021-05-30

## 2021-05-12 ENCOUNTER — Encounter

## 2021-05-12 MED ORDER — MUPIROCIN 2 % OINTMENT
2 % | CUTANEOUS | 0 refills | Status: AC
Start: 2021-05-12 — End: ?

## 2021-05-12 MED ORDER — AMOXICILLIN CLAVULANATE 875 MG-125 MG TAB
875-125 mg | ORAL_TABLET | ORAL | 0 refills | Status: AC
Start: 2021-05-12 — End: 2021-06-01

## 2021-05-12 NOTE — Telephone Encounter (Signed)
Pt called  States in chair at dentist    States dr appa signed a check list but did not put a stamp so the dentist wont accept it    Advised pt per team lead, that dr appa is not in office and unfortunately reschedule is needed and we will need to speak with dr appa on Monday    Pt states understanding

## 2021-05-12 NOTE — Telephone Encounter (Signed)
States the clearance for extraction was not accepted    States that they required a stamp or a hand written note    States we need to write a letter of clearance as they do not have a form to provide    Clearance for extraction  Needs to say pt can get the extractions    Fax [phone]  Phone [phone]  Dr Thresa Ross  [facility]  [address] [place] [address]

## 2021-05-15 MED ORDER — ALBUTEROL SULFATE HFA 90 MCG/ACTUATION AEROSOL INHALER
90 mcg/actuation | RESPIRATORY_TRACT | 7 refills | Status: AC
Start: 2021-05-15 — End: ?

## 2021-05-15 NOTE — Telephone Encounter (Signed)
Not needed last potassium was 4.3

## 2021-05-15 NOTE — Telephone Encounter (Signed)
Need to clarify need for antibiotic

## 2021-05-16 NOTE — Telephone Encounter (Signed)
Letter done please fax accordingly

## 2021-05-16 NOTE — Telephone Encounter (Signed)
Faxed letter to [phone]

## 2021-05-30 MED ORDER — FUROSEMIDE 40 MG TAB
40 mg | ORAL_TABLET | Freq: Every day | ORAL | 1 refills | Status: AC
Start: 2021-05-30 — End: ?

## 2021-05-30 NOTE — Telephone Encounter (Signed)
PCP: Elissa Hefty, MD    Last appt: 04/14/2021  Future Appointments   Date Time Provider Department Center   06/01/2021  2:00 PM Lincoln Brigham, MD [facility] BS AMB       Requested Prescriptions     Pending Prescriptions Disp Refills    furosemide (LASIX) 40 mg tablet 30 Tablet 1     Sig: Take 1 Tablet by mouth daily.

## 2021-06-01 ENCOUNTER — Ambulatory Visit: Admit: 2021-06-01 | Discharge: 2021-06-01 | Payer: MEDICAID | Attending: Internal Medicine | Primary: Internal Medicine

## 2021-06-01 DIAGNOSIS — G4733 Obstructive sleep apnea (adult) (pediatric): Secondary | ICD-10-CM

## 2021-06-01 NOTE — Progress Notes (Signed)
Progress Notes by Lincoln Brigham, MD at 06/01/21 1400                Author: Lincoln Brigham, MD  Service: --  Author Type: Physician       Filed: 06/01/21 1809  Encounter Date: 06/01/2021  Status: Signed          Editor: Lincoln Brigham, MD (Physician)                                          [address] [place], [place] [address]   Tel.  [phone]   Fax. [phone]  [address]   [place], [place] [address]   Tel.  [phone]   Fax. [phone]  [address]   [place], [place] [address]   Tel.  [phone]   Fax. [phone]                Subjective:         John Duke is an 41 y.o. male referred for evaluation for a sleep disorder. He complains of snoring, periods of not breathing associated with excessive daytime sleepiness.  Symptoms began 30 years ago, gradually worsening since that time. He usually  can fall asleep in 60+ minutes.  Family or house members note snoring, periods of not breathing. He denies falling asleep while reading.   Cylas Falzone does wake up frequently at night. He is bothered by waking up too early and left unable to get back to sleep. He actually sleeps about 3 hours  at night and wakes up about 5 times during the night. He does not work shifts:  Earvin Hansen indicates he does get too little sleep at night. His bedtime is 2300. He awakens at 0300. He does take naps. He takes 15 to 20 naps a week lasting 15 to 30. He has the following observed behaviors: Loud snoring, Twitching of legs or feet, Pauses  in breathing, Sleep talking, Sleepwalking, Bedwetting, Sitting up in bed while still asleep, Kicking with legs, Getting out of the bed while still asleep, Becoming very rigid and/or shaking;  .   Other remarks:  He he says he does not sleep well.  He says he sometimes self medicates with marijuana.   He monitors his blood pressure at home and readings are usually around 135-137 over low 90s   Epworth Sleepiness Score: 14   which reflect  moderate daytime drowsiness.        Allergies        Allergen  Reactions         ?  Strawberry  Anaphylaxis     ?  Haloperidol  Other (comments)             "locks my jaw"         ?  Naproxen  Itching     ?  Nsaids (Non-Steroidal Anti-Inflammatory Drug)  Cough     ?  Seroquel [Quetiapine]  Swelling     ?  Tomato  Anaphylaxis     ?  Tramadol  Other (comments)             headache         ?  Tuberculin Ppd  Hives              Current Outpatient Medications:    ?  furosemide (LASIX) 40 mg tablet, Take 1 Tablet by mouth daily., Disp: 30 Tablet, Rfl: 1   ?  albuterol (PROVENTIL HFA, VENTOLIN HFA, PROAIR HFA) 90 mcg/actuation inhaler, INHALE 2 PUFFS BY MOUTH EVERY 4 HOURS AS NEEDED FOR WHEEZING, Disp: 6.7 Each, Rfl: 7   ?  mupirocin (BACTROBAN) 2 % ointment, APPLY TO AFFECTED AREA EVERY DAY, Disp: 22 g, Rfl: 0   ?  sildenafil citrate (VIAGRA) 100 mg tablet, Take 0.5 Tablets by mouth as needed for Erectile Dysfunction., Disp: 30 Tablet, Rfl: 3   ?  lidocaine (LIDODERM) 5 %, APPLY 1 PATCH TOPICALLY TO THE AFFECTED AREA FOR 12 HOURS A DAY AND REMOVE FOR 12 HOURS A DAY., Disp: 30 Patch, Rfl: 1   ?  pregabalin (LYRICA) 100 mg capsule, TAKE 1 CAPSULE BY MOUTH TWO (2) TIMES A DAY. MAX DAILY AMOUNT: 200 MG., Disp: 60 Capsule, Rfl: 1   ?  albuterol (PROVENTIL VENTOLIN) 2.5 mg /3 mL (0.083 %) nebu, INHALE 1 VIAL VIA NEBULIZER EVERY 6 HOURS AS NEEDED FOR WHEEZING, Disp: 75 mL, Rfl: 2   ?  montelukast (SINGULAIR) 10 mg tablet, TAKE 1 TABLET BY MOUTH EVERY DAY, Disp: 90 Tablet, Rfl: 1   ?  pantoprazole (PROTONIX) 40 mg tablet, Take 1 Tablet by mouth Before breakfast and dinner., Disp: 180 Tablet, Rfl: 1   ?  potassium chloride (K-DUR, KLOR-CON M20) 20 mEq tablet, Take 1 Tablet by mouth daily., Disp: 30 Tablet, Rfl: 1   ?  mirtazapine (REMERON) 30 mg tablet, TAKE 1 TABLET BY MOUTH EVERY DAY AT NIGHT, Disp: 90 Tablet, Rfl: 1   ?  albuterol sulfate (PROAIR RESPICLICK) 90 mcg/actuation breath activated inhaler, Take 2 Puffs by inhalation  four (4) times daily as needed for Wheezing., Disp: 1 Each, Rfl: 1   ?  Nebulizer & Compressor machine, Use as needed for dyspnea, Disp: 1 Each, Rfl: 0   ?  amoxicillin-clavulanate (AUGMENTIN) 875-125 mg per tablet, TAKE 1 TABLET BY MOUTH EVERY 12 HOURS, Disp: 14 Tablet, Rfl: 0   ?  doxycycline (ADOXA) 100 mg tablet, Take 1 Tablet by mouth two (2) times a day., Disp: 28 Tablet, Rfl: 0   ?  trimethoprim-sulfamethoxazole (BACTRIM DS, SEPTRA DS) 160-800 mg per tablet, Take 1 Tablet by mouth two (2) times a day., Disp: 20 Tablet, Rfl: 0   ?  hydroCHLOROthiazide (HYDRODIURIL) 12.5 mg tablet, TAKE 1 TABLET BY MOUTH EVERY DAY, Disp: 90 Tablet, Rfl: 1   ?  levocetirizine (XYZAL) 5 mg tablet, Take 1 Tablet by mouth nightly., Disp: 90 Tablet, Rfl: 1       He  has a past medical history of Aggressive outburst, Anxiety disorder, Asthma, Bipolar 1 disorder (HCC), Depression, Genital herpes, Hypertension, Paranoid  schizophrenia (HCC), PTSD (post-traumatic stress disorder), Sleep disorder, Substance abuse (HCC), Suicidal thoughts, Trauma (1993), Vocal cord paralysis, and Withdrawal syndrome (HCC).      He  has a past surgical history that includes pr unlisted procedure cardiac surgery; hx other surgical; hx heent; and hx cyst removal (08/23/14).      He family history includes Diabetes in his mother.      He  reports that he has been smoking cigarettes. He has been smoking an average of 1 pack per day. He has never used smokeless tobacco. He reports current drug use. Drug: Marijuana. He reports that he does not drink alcohol.        Review of Systems:   Constitutional: Some recent mild weight loss  Eyes:  No blurred vision.   CVS:  No significant chest pain   Pulm:  No significant shortness of breath when asthma acting up   GI:  No significant nausea or vomiting,+GERD   GU:  + significant nocturia   Musculoskeletal:  No significant joint pain at night   Skin:  No significant rashes,some itching on legs   Neuro:  No significant  dizziness    Psych: treated for depression and anxiety,PTSD      Sleep Review of Systems: notable for + difficulty falling asleep; +frequent awakenings at night;  rare dreaming noted; no nightmares ; + early  morning headaches; no memory problems; no concentration issues; no history of any automobile or occupational accidents due to daytime drowsiness.           Objective:     Visit Vitals      BP  (!) 143/85 (BP 1 Location: Left upper arm, BP Patient Position: Sitting, BP Cuff Size: Adult long)     Pulse  80     Ht  6' (1.829 m)     Wt  269 lb 3.2 oz (122.1 kg)     SpO2  93%        BMI  36.51 kg/m                 General:    Not in acute distress     Eyes:   Anicteric sclerae, no obvious strabismus     Nose:   No obvious nasal septum deviation      Oropharynx:    Class 3 oropharyngeal outlet, thick tongue base, enlarged and boggy uvula, low-lying soft palate, narrow tonsilo-pharyngeal pilars     Tonsils:    tonsils are present and normal     Neck:     ;17 inches; midline trachea        Chest/Lungs:   Equal lung expansion, clear on auscultation      CVS:   Normal rate, regular rhythm; no JVD     Skin:   Warm to touch; no obvious rashes     Neuro:   No focal deficits ; no obvious tremor         Psych:   Normal affect,  normal countenance;                Assessment:                  ICD-10-CM  ICD-9-CM             1.  Obstructive sleep apnea (adult) (pediatric)   G47.33  327.23  SLEEP STUDY UNATTENDED, 4 CHANNEL                  2.  Primary hypertension   I10  401.9                    3.  Obesity, Class II, BMI 35-39.9   E66.9  278.00                         Plan:        * The patient currently has a High Risk for having sleep apnea.  STOP-BANG score 7.   * HSAT was ordered for initial evaluation.    Treatment options for sleep apnea were reviewed.    he would like to proceed with a trial of PAP if found to have significant apnea.   *  He was provided information on sleep apnea including coresponding risk factors and  the importance of proper treatment.   * Counseling was provided regarding proper sleep hygiene and safe driving.   He understands that sedating substances/medications can worsen sleep apnea.   2. Hypertension -borderline control on current regimen. he will continue his current regimen. he will continue to monitor at home and with his PMD for further adjustments as needed.    I have reviewed the relationship between hypertension as it relates  to sleep-disordered breathing.        3. Obesity - weight has been starting to go down. I have discussed the relationship of weight to obstructive sleep apnea. I have advised him to continue efforts at a weight loss plan.. he understands that weight loss can reduce severity of sleep apnea  and snoring.       Obesity - weight has been recently going down. I have discussed the relationship of weight to obstructive sleep apnea. I have advised him to continue efforts at a weight l he understands that weight loss can reduce severity of sleep apnea and snoring.       Thank you for allowing [place] to participate in your patient's medical care.  We'll keep you updated on these investigations.   Electronically signed by      Miguel Aschoff, MD   Diplomate in Sleep Medicine   [facility]   06/01/2021   (Parts of this dictation were completed with voice recognition software. Quite often unanticipated grammatical, syntax, homophones, and other interpretive errors are inadvertently transcribed by the computer software.Please excuse any errors that have  escaped final proofreading.)

## 2021-07-11 ENCOUNTER — Encounter

## 2021-07-11 MED ORDER — PREGABALIN 100 MG PO CAPS
100 MG | ORAL_CAPSULE | Freq: Two times a day (BID) | ORAL | 1 refills | Status: AC
Start: 2021-07-11 — End: 2021-09-09

## 2021-07-11 NOTE — Telephone Encounter (Signed)
-----   Message from Elissa Hefty, MD sent at 07/10/2021  3:50 PM EDT -----  Regarding: FW: Refill pregabalin 100 MG capsule  Contact: [phone]  Can you pend this for me   ----- Message -----  From: Sharyn Lull Loving  Sent: 07/10/2021   2:34 PM EDT  To: Elissa Hefty, MD  Subject: FW: Refill pregabalin 100 MG capsule               ----- Message -----  From: Carolanne Grumbling  Sent: 07/10/2021   2:29 PM EDT  To: , #  Subject: Refill pregabalin 100 MG capsule                 Hey Dr how are you? I need my pregabalin 100 MG capsule filled at [address] [facility] won't allow [facility] to [facility] it due to the type of medication an I've recently moved an this is the closest pharmacy to my home. Thanks a lot an also could I set up a zoom with you I've been seeing blood in stoll an when I wipe during bathroom usage .

## 2021-07-20 NOTE — Progress Notes (Signed)
No prior authorization required for HSAT  with [place] on 07/20/2021.

## 2021-08-08 ENCOUNTER — Ambulatory Visit: Admit: 2021-08-08 | Discharge: 2021-08-08 | Primary: Internal Medicine

## 2021-08-08 ENCOUNTER — Inpatient Hospital Stay: Admit: 2021-08-15 | Payer: PRIVATE HEALTH INSURANCE | Primary: Internal Medicine

## 2021-08-08 DIAGNOSIS — G4733 Obstructive sleep apnea (adult) (pediatric): Secondary | ICD-10-CM

## 2021-08-08 NOTE — Progress Notes (Signed)
S>John Duke is a 41 y.o. male seen today to receive a home sleep testing unit 8845 (HST).    Patient was educated on proper hookup and operation of the HST via detailed instruction sheet .  Belts were fitted and adjusted for the patient during this session.  Instruction forms with after hours contact and documentation were signed.    O>    There were no vitals taken for this visit.      A>  1. OSA (obstructive sleep apnea)          P>  General information regarding operations and maintenance of the device was provided.  Follow-up appointment was made to return the [facility] 08/09/21. He will be contacted once the results have been reviewed.  He was asked to contact our office for any problems regarding his home sleep test study.

## 2021-08-09 ENCOUNTER — Ambulatory Visit: Payer: PRIVATE HEALTH INSURANCE | Primary: Internal Medicine

## 2021-08-09 ENCOUNTER — Ambulatory Visit: Admit: 2021-08-09 | Discharge: 2021-08-09 | Primary: Internal Medicine

## 2021-08-14 ENCOUNTER — Telehealth

## 2021-08-15 MED ORDER — FUROSEMIDE 40 MG PO TABS
40 MG | ORAL_TABLET | ORAL | 5 refills | Status: DC
Start: 2021-08-15 — End: 2022-05-08

## 2021-08-16 NOTE — Progress Notes (Signed)
HSAT report routed to referring provider Raquel Appa-Falcao, MD.

## 2021-08-16 NOTE — Progress Notes (Signed)
PAP & Supply order faxed to [facility], scanned into media and letter mailed to patient to advise.

## 2021-08-16 NOTE — Telephone Encounter (Signed)
Results of sleep study in R-drive  Lead tech to convey results to patient  HSAT results in R-drive. Test positive for severe sleep apnea. AHI 40/hour and lowest oxygen saturation was 86%. We had discussed treatment options at initial consultation. Based on the results of the home sleep apnea test, I believe a trial of APAP would be an effective mode of therapy. APAP order attached. he should be seen in the sleep disorder center 4-6 weeks after initiating PAP therapy.     Patient should call the office the day he gets set up with new PAP device so we can schedule him for an adherence/compliance visit within 31-90 days of obtaining a new device.   Front staff to Order PAP and call patient and let them know which DME company they should be hearing from after results reviewed with lead support technologist.     he will need a first adherence visit.

## 2021-08-18 ENCOUNTER — Encounter

## 2021-08-22 MED ORDER — AMOXICILLIN-POT CLAVULANATE 875-125 MG PO TABS
875-125 MG | ORAL_TABLET | ORAL | 0 refills | Status: AC
Start: 2021-08-22 — End: 2021-12-08

## 2021-08-22 MED ORDER — LIDOCAINE 5 % EX PTCH
5 % | MEDICATED_PATCH | CUTANEOUS | 1 refills | Status: DC
Start: 2021-08-22 — End: 2022-05-08

## 2021-08-22 MED ORDER — MUPIROCIN 2 % EX OINT
2 % | CUTANEOUS | 1 refills | Status: DC
Start: 2021-08-22 — End: 2021-12-10

## 2021-08-22 MED ORDER — KLOR-CON M20 20 MEQ PO TBCR
20 MEQ | ORAL_TABLET | ORAL | 1 refills | Status: DC
Start: 2021-08-22 — End: 2021-09-16

## 2021-09-17 MED ORDER — KLOR-CON M20 20 MEQ PO TBCR
20 MEQ | ORAL_TABLET | ORAL | 1 refills | Status: DC
Start: 2021-09-17 — End: 2022-05-08

## 2021-10-06 NOTE — Telephone Encounter (Signed)
Future Appointments:  No future appointments.     Last Appointment With Me:  04/14/2021     Requested Prescriptions     Pending Prescriptions Disp Refills    potassium chloride (KLOR-CON M20) 20 MEQ extended release tablet 30 tablet 1     Sig: Take 1 tablet by mouth daily

## 2021-10-13 MED ORDER — ALBUTEROL SULFATE (2.5 MG/3ML) 0.083% IN NEBU
RESPIRATORY_TRACT | 2 refills | Status: AC
Start: 2021-10-13 — End: ?

## 2021-10-25 NOTE — Progress Notes (Signed)
Patient returned the home sleep apnea test (HSAT) device intact and without evident damage.

## 2021-10-31 ENCOUNTER — Encounter

## 2021-11-01 NOTE — Telephone Encounter (Signed)
Antibiotics need appointment

## 2021-11-21 NOTE — Telephone Encounter (Signed)
Left generic message for patient to give the office a call back.

## 2021-11-21 NOTE — Telephone Encounter (Signed)
-----   Message from [place] Wilson sent at 11/21/2021  1:53 PM EDT -----  Subject: Appointment Request    Reason for Call: Established Patient Appointment needed: Flu Shot    QUESTIONS    Reason for appointment request? No appointments available during search     Additional Information for Provider? Patient would like to get the flu   vaccination.   ---------------------------------------------------------------------------  --------------  [place]  [phone]; OK to leave message on voicemail  ---------------------------------------------------------------------------  --------------  SCRIPT ANSWERS

## 2021-12-08 ENCOUNTER — Encounter

## 2021-12-08 MED ORDER — AMOXICILLIN-POT CLAVULANATE 875-125 MG PO TABS
875-125 MG | ORAL_TABLET | Freq: Two times a day (BID) | ORAL | 0 refills | Status: DC
Start: 2021-12-08 — End: 2022-05-08

## 2021-12-08 NOTE — Progress Notes (Signed)
Formatting of this note is different from the original.  Images from the original note were not included.  Location of Visit: Telehealth Home Setting    Visit information obtained by:: Patient    Where is member physically located at time of visit? [facility](City): [place]  Where is member physically located at time of visit? [facility](State): [place]                      Was consent validated prior to start of visit?: Yes        Reason for Visit:  Chief Complaint   Patient presents with    [facility]: Mental Illness     Vital Signs:  Vitals Recorded in This Encounter         12/08/2021  2352 12/08/2021  2353        Weight: 278 lb (126 kg)  --       patient reported       Height: 6' (1.829 m)  --       patient reported       Pain Score: -- 0      Body Mass Index: 37.70 kg/m --               Allergies:  Allergies   Allergen Reactions    Haloperidol Anaphylaxis     Other reaction(s): Other (See Comments)  Other reaction(s): Other (comments)  "locks my jaw"  "locks my jaw"     Penicillins Anaphylaxis    Strawberry Anaphylaxis    Tomato Anaphylaxis    Trazodone Anaphylaxis    Mirtazapine      Other reaction(s): Other (comments)    Naproxen Itching    Nsaids (Non-Steroidal Anti-Inflammatory Drug)      Other reaction(s): Cough, Other    Quetiapine Nausea And Vomiting and Swelling    Tramadol      Other reaction(s): Other (See Comments)  Other reaction(s): Other (comments)  headache  headache     Tuberculin Ppd Hives     Review of Medications:   Current Outpatient Medications:     albuterol 2.5 mg /3 mL (0.083 %) nebulizer solution, INHALE 1 VIAL VIA NEBULIZER EVERY 6 HOURS AS NEEDED FOR WHEEZING, Disp: , Rfl:     albuterol sulfate 90 mcg/actuation aerosol powdr breath activated, Inhale 2 puffs., Disp: , Rfl:     furosemide (LASIX) 40 mg tablet, Take 1 tablet by mouth 1 (one) time each day., Disp: , Rfl:     levocetirizine (XYZAL) 5 mg tablet, Take 1 tablet by mouth every night., Disp: , Rfl:     mirtazapine (REMERON) 30 mg tablet, Take 1 tablet by  mouth every night., Disp: , Rfl:     montelukast (SINGULAIR) 10 mg tablet, Take 1 tablet by mouth 1 (one) time each day., Disp: , Rfl:     pantoprazole (PROTONIX) 40 mg EC tablet, Take 40 mg by mouth., Disp: , Rfl:     potassium chloride (Klor-Con M20) 20 mEq CR tablet, Take 1 tablet by mouth 1 (one) time each day., Disp: , Rfl:     sildenafiL (VIAGRA) 100 mg tablet, TAKE 1 TABLET BY MOUTH ONCE DAILY AS NEEDED FOR ERECTILE DYSFUNCTION, Disp: , Rfl:     pregabalin (LYRICA) 100 mg capsule, TAKE 1 CAPSULE BY MOUTH 2 TIMES DAILY FOR 60 DAYS. MAX DAILY AMOUNT: 200 MG, Disp: , Rfl:       Medication reconciliation completed?: Yes  Were active medication indications discussed and understood by  the patient?: Yes  Due to the COVID pandemic, are there any current barriers to taking medications as indicated?: None      History reviewed. No pertinent family history.  Social History     Tobacco Use   Smoking Status Never   Smokeless Tobacco Never     Counseling given: Not Answered    Social History     Substance and Sexual Activity   Alcohol Use Never     Social History     Substance and Sexual Activity   Drug Use Never     Social History     Substance and Sexual Activity   Sexual Activity Defer         Post-Discharge Coordination:  Systems analyst with BH/PCP: I know my BH Provider/PCP but have not seen since discharge  BH Provider/PCP name: [employer] Provider/PCP address: n/a  Phone number: n/a  Contact name: n/a  Outcome of call: tbd  Advised to discuss with BH Provider/PCP: Education needed  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 needed  Recent hospitalization: Voluntary  Reason for admission: Other Behavorial Health  How many total psychiatric hospitalizations: 1  Prognosis (describe patient's ability/capacity to respond to treatment): tbd    Depression Screening (PHQ-9):  Little interest or pleasure in doing things: Several days  Feeling down, depressed, or  hopeless: Several days  Trouble falling or staying asleep, or sleeping too much: Several days  Feeling tired or having little energy: Not at all  Poor appetite or overeating: Not at all  Feeling bad about yourself - or that you are a failure or have let yourself or your family down: Not at all  Trouble concentrating on things, such as reading the newspaper or watching television: Not at all  Moving or speaking so slowly that other people could have noticed, or the opposite - being so fidgety or restless that you have been moving around a lot more than usual: Not at all  Thoughts that you would be better off dead, or of hurting yourself in some way: Not at all  PHQ-9 Total Score: 3    How difficult have these problems made it for you to do your work, take care of things at home, or get along with other people?: Not difficult at all  Was the PHQ 9 completed?: Yes        Please press confirm if the PHQ was negative (score less than 5): Confirm            Influenza:      Diabetic Quality:  Excluded from Diabetic Quality Measures?: Not on Diabetes registry        Kidney Health:  eGFR:            Urine Albumin Creatinine Ratio:                        AUDIT-C:                              DAST:                                              Generalized Anxiety Disorder 7 Item:    [place] Suicide Risk Assessment:  SOWS:      CIWA-AR:      AIMS:      HM Reviewed:  I have reviewed health maintenance and advised patient to follow up with PCP/specialist for the following: COVID-19 Vaccine, DTap, Tdap, Td vaccine, Pneumococcal vaccine, Influenza vaccine        Lab Results:  No results found for any previous visit.     19Q HRA:  HRA - 19Q  Do you have a language need other than English?: None  Do you have any special preferences we should be aware of?: Hearing Impairment  What is your main health concern right now?: Other  Compared to others your age, would you say your health is?: Good  Do you worry about your  memory or have you been told by friends or family that they are worried about your memory?: No  Have you visited the Emergency Room in the past 6 months?: No  Have you stayed overnight in the hospital in the past 6 months?: No  In the past, have you ever thought about harming yourself?: No  Do you currently have any thoughts about harming yourself? Do you have a plan?: No  Do you need assistance with any of the following & is your need not being met today?: None  What is your current living situation?: Live with others unrelated  Does the place where you live have any of the following?: None  Any other areas of concern related to your health?: None    SDOH:  Social Determinants of Health  Do you sometimes run out of money to pay for food, rent, bills, and medicine?: No  Has lack of transportation kept you from medical appointments, meetings, work or from getting things needed for daily living?: No  This member's Programmatic Level is: Level I    HPI:  HPI    Review of Systems:  Review of Systems   Constitutional:  Negative for chills, diaphoresis, fever, malaise/fatigue and weight loss.   HENT:  Negative for congestion, ear discharge, ear pain, hearing loss, nosebleeds, sinus pain, sore throat and tinnitus.    Eyes:  Negative for blurred vision, double vision, photophobia, pain, discharge and redness.   Respiratory:  Positive for cough and shortness of breath. Negative for hemoptysis, sputum production, wheezing and stridor.    Cardiovascular:  Negative for chest pain, palpitations, orthopnea, claudication, leg swelling and PND.   Gastrointestinal:  Positive for heartburn. Negative for abdominal pain, blood in stool, constipation, diarrhea, melena, nausea and vomiting.   Genitourinary:  Negative for dysuria, flank pain, frequency, hematuria and urgency.   Musculoskeletal:  Negative for back pain, falls, joint pain, myalgias and neck pain.   Skin:  Negative for itching and rash.   Neurological:  Negative for  dizziness, tingling, tremors, sensory change, speech change, focal weakness, seizures, loss of consciousness, weakness and headaches.   Endo/Heme/Allergies:  Negative for environmental allergies and polydipsia. Does not bruise/bleed easily.   Psychiatric/Behavioral:  Negative for depression, hallucinations, memory loss and suicidal ideas. The patient is not nervous/anxious and does not have insomnia.         Mood     Physical Exam:  Physical Exam  Constitutional:       Appearance: Normal appearance.   HENT:      Head: Normocephalic.   Pulmonary:      Effort: Pulmonary effort is normal.   Musculoskeletal:         General: Normal range of motion.  Cervical back: Normal range of motion.   Neurological:      General: No focal deficit present.      Mental Status: He is alert and oriented to person, place, and time.     Assessment & Treatment Plan  Diagnoses based upon a face-to-face evaluation, medication and chart review, physical exam, and ROS    Requested patient to keep all scheduled appointment and continue follow-up with PCP/Specialist    Mild mood disorder (CMS/HCC) (Primary)  Assessment & Plan:  Condition: stable    Follow up in: if symptoms worsen or fail to improve    Orders:  -     mirtazapine (REMERON) 30 mg tablet; Take 1 tablet by mouth every night.  -     potassium chloride (Klor-Con M20) 20 mEq CR tablet; Take 1 tablet by mouth 1 (one) time each day.  -     pregabalin (LYRICA) 100 mg capsule; TAKE 1 CAPSULE BY MOUTH 2 TIMES DAILY FOR 60 DAYS. MAX DAILY AMOUNT: 200 MG    Hospital discharge follow-up  Overview:    Assessment & Plan:  Discharge date: 11/1  Discharge location: home    Hospitalization reason: mdd    Follow up appts scheduled (include date, specialty and reason): tbd    Have you picked up the most recently prescribed medications from the pharmacy? Yes    Orders:  -     sildenafiL (VIAGRA) 100 mg tablet; TAKE 1 TABLET BY MOUTH ONCE DAILY AS NEEDED FOR ERECTILE DYSFUNCTION    Body mass index  (BMI) of 37.0-37.9 in adult    Mild intermittent asthma without complication  Assessment & Plan:  Condition: stable    Reviewed trigger avoidance and reviewed proper use of inhalers and rescue medications. Reviewed concerning signs/symptoms and ER precautions.      Follow up in: if symptoms worsen or fail to improve    Orders:  -     albuterol 2.5 mg /3 mL (0.083 %) nebulizer solution; INHALE 1 VIAL VIA NEBULIZER EVERY 6 HOURS AS NEEDED FOR WHEEZING  -     albuterol sulfate 90 mcg/actuation aerosol powdr breath activated; Inhale 2 puffs.  -     furosemide (LASIX) 40 mg tablet; Take 1 tablet by mouth 1 (one) time each day.  -     montelukast (SINGULAIR) 10 mg tablet; Take 1 tablet by mouth 1 (one) time each day.  -     levocetirizine (XYZAL) 5 mg tablet; Take 1 tablet by mouth every night.    Gastroesophageal reflux disease without esophagitis  Assessment & Plan:  Condition: stable    Reviewed use of antacid medication and/or diet modifications of decreasing caffeine, spicy foods, chocolate, and avoiding alcohol, tobacco, NSAIDs, and reducing citrus acids.     Follow up in: if symptoms worsen or fail to improve    Orders:  -     pantoprazole (PROTONIX) 40 mg EC tablet; Take 40 mg by mouth.      Electronically signed by Bing Quarry, PhD, FNP-C, PMHNP-BC at 12/12/2021  9:17 PM PST

## 2021-12-09 ENCOUNTER — Encounter

## 2021-12-11 MED ORDER — MUPIROCIN 2 % EX OINT
2 % | CUTANEOUS | 1 refills | Status: DC
Start: 2021-12-11 — End: 2022-05-08

## 2021-12-13 NOTE — Assessment & Plan Note (Signed)
Associated Problem(s): Hospital discharge follow-up  Formatting of this note might be different from the original.  Discharge date: 11/1  Discharge location: home    Hospitalization reason: mdd    Follow up appts scheduled (include date, specialty and reason): tbd    Have you picked up the most recently prescribed medications from the pharmacy? Yes  Electronically signed by Bing Quarry, PhD, FNP-C, PMHNP-BC at 12/12/2021  9:02 PM PST

## 2021-12-13 NOTE — Assessment & Plan Note (Signed)
Associated Problem(s): Gastroesophageal reflux disease without esophagitis  Formatting of this note might be different from the original.  Condition: stable    Reviewed use of antacid medication and/or diet modifications of decreasing caffeine, spicy foods, chocolate, and avoiding alcohol, tobacco, NSAIDs, and reducing citrus acids.     Follow up in: if symptoms worsen or fail to improve  Electronically signed by Bing Quarry, PhD, FNP-C, PMHNP-BC at 12/12/2021  9:12 PM PST

## 2021-12-13 NOTE — Assessment & Plan Note (Signed)
Associated Problem(s): Opioid dependence (CMS/HCC) (Resolved 12/13/2021)  Formatting of this note might be different from the original.  Condition: Stable    Education:   Treatment usually includes medicines, group therapy, one or more types of counseling, and education.   Medicines may be used to help you quit. They may help to control cravings, ease withdrawal symptoms, and prevent relapse. This treatment is called medication-assisted treatment, or MAT.   Treatment focuses on more than substance use. It helps you cope with the feelings that often happen when people try to stop using substances.     Reason for use: recreat    Duration of use: unknow    Narcan Available: Narcan Yes/No: N/A (non-opioid use)    Previous interventions: Interventions: Inpatient    Outcome of previous interventions: n/a    Are you ready to quit? Yes    Resources reviewed: [facility]Aunt Doristine Counter and [employer]    Follow up Provider: PCP    Follow up in: If symptoms worsen or fail to improve      Electronically signed by Bing Quarry, PhD, FNP-C, PMHNP-BC at 12/12/2021  9:04 PM PST

## 2021-12-13 NOTE — Assessment & Plan Note (Signed)
Associated Problem(s): Mild mood disorder (CMS/HCC)  Formatting of this note might be different from the original.  Condition: stable    Follow up in: if symptoms worsen or fail to improve  Electronically signed by Bing Quarry, PhD, FNP-C, PMHNP-BC at 12/12/2021  9:06 PM PST

## 2021-12-13 NOTE — Assessment & Plan Note (Signed)
Associated Problem(s): Mild intermittent asthma without complication  Formatting of this note might be different from the original.  Condition: stable    Reviewed trigger avoidance and reviewed proper use of inhalers and rescue medications. Reviewed concerning signs/symptoms and ER precautions.      Follow up in: if symptoms worsen or fail to improve  Electronically signed by Bing Quarry, PhD, FNP-C, PMHNP-BC at 12/12/2021  9:11 PM PST

## 2022-03-09 MED ORDER — SILDENAFIL CITRATE 100 MG PO TABS
100 MG | ORAL_TABLET | ORAL | 0 refills | Status: AC
Start: 2022-03-09 — End: 2022-05-08

## 2022-03-09 NOTE — Telephone Encounter (Signed)
PCP: Aundria Mems, MD    Last appt:   04/14/2021    Future Appointments   Date Time Provider [place]   05/08/2022 11:30 AM Appa Wonda Olds, MD [facility] BS AMB       Requested Prescriptions     Pending Prescriptions Disp Refills    sildenafil (VIAGRA) 100 MG tablet 30 tablet 0     Sig: TAKE 1 TABLET BY MOUTH ONCE DAILY AS NEEDED FOR ERECTILE DYSFUNCTION

## 2022-03-09 NOTE — Telephone Encounter (Signed)
-----   Message from Donnelly Angelica sent at 03/09/2022 12:34 PM EST -----  Regarding: Refill  Contact: [phone]  Hey dr appa could you call in me more refills for my viagra please right now im on 100mg  30qty thanks alot. Oh an my pharmacy has changed its [facility] [address] in [place]

## 2022-05-08 ENCOUNTER — Ambulatory Visit: Admit: 2022-05-08 | Payer: PRIVATE HEALTH INSURANCE | Attending: Internal Medicine | Primary: Internal Medicine

## 2022-05-08 DIAGNOSIS — M5431 Sciatica, right side: Secondary | ICD-10-CM

## 2022-05-08 DIAGNOSIS — F1994 Other psychoactive substance use, unspecified with psychoactive substance-induced mood disorder: Secondary | ICD-10-CM

## 2022-05-08 MED ORDER — MONTELUKAST SODIUM 10 MG PO TABS
10 | ORAL_TABLET | Freq: Every day | ORAL | 5 refills | Status: AC
Start: 2022-05-08 — End: ?

## 2022-05-08 MED ORDER — SILDENAFIL CITRATE 100 MG PO TABS
100 | ORAL_TABLET | ORAL | 0 refills | Status: DC
Start: 2022-05-08 — End: 2022-05-08

## 2022-05-08 MED ORDER — TRIAMTERENE-HCTZ 37.5-25 MG PO TABS
37.5-25 MG | ORAL_TABLET | Freq: Every day | ORAL | 1 refills | Status: AC
Start: 2022-05-08 — End: 2023-03-04

## 2022-05-08 MED ORDER — MUPIROCIN 2 % EX OINT
2 | CUTANEOUS | 1 refills | Status: AC
Start: 2022-05-08 — End: ?

## 2022-05-08 MED ORDER — PREGABALIN 100 MG PO CAPS
100 | ORAL_CAPSULE | Freq: Two times a day (BID) | ORAL | 1 refills | Status: AC
Start: 2022-05-08 — End: 2022-07-07

## 2022-05-08 MED ORDER — SILDENAFIL CITRATE 100 MG PO TABS
100 MG | ORAL_TABLET | ORAL | 0 refills | Status: DC
Start: 2022-05-08 — End: 2022-07-03

## 2022-05-08 MED ORDER — LIDOCAINE 5 % EX PTCH
5 | MEDICATED_PATCH | CUTANEOUS | 1 refills | Status: AC
Start: 2022-05-08 — End: ?

## 2022-05-08 MED ORDER — PANTOPRAZOLE SODIUM 40 MG PO TBEC
40 | ORAL_TABLET | Freq: Two times a day (BID) | ORAL | 5 refills | Status: AC
Start: 2022-05-08 — End: ?

## 2022-05-08 MED ORDER — ARIPIPRAZOLE 5 MG PO TABS
5 MG | ORAL_TABLET | Freq: Every day | ORAL | 3 refills | Status: AC
Start: 2022-05-08 — End: 2022-05-31

## 2022-05-08 MED ORDER — MIRTAZAPINE 30 MG PO TABS
30 MG | ORAL_TABLET | Freq: Every evening | ORAL | 5 refills | Status: AC
Start: 2022-05-08 — End: 2022-05-31

## 2022-05-08 MED ORDER — AMOXICILLIN-POT CLAVULANATE 875-125 MG PO TABS
875-125 | ORAL_TABLET | Freq: Two times a day (BID) | ORAL | 0 refills | Status: AC
Start: 2022-05-08 — End: 2022-05-15

## 2022-05-08 NOTE — Patient Instructions (Signed)
Quit vaping you have wheezing and it is affecting your lungs    Abilify to help with mood    Resume the blood pressure medication prescription sent

## 2022-05-08 NOTE — Progress Notes (Signed)
Chief Complaint   Patient presents with    Annual Exam     "Have you been to the ER, urgent care clinic since your last visit?  Hospitalized since your last visit?"    NO    "Have you seen or consulted any other health care providers outside of [facility] since your last visit?"    NO
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
## 2022-05-10 NOTE — Telephone Encounter (Signed)
Notified by PCP that patient having trouble with CPAP.  Looks like ordered in summer 2023 but not seen for first adherence.  Staff to schedule for follow-up visit.  Can be with NP or me

## 2022-05-11 NOTE — Telephone Encounter (Signed)
Pt scheduled  

## 2022-05-31 ENCOUNTER — Encounter

## 2022-05-31 MED ORDER — MIRTAZAPINE 30 MG PO TABS
30 MG | ORAL_TABLET | Freq: Every evening | ORAL | 2 refills | Status: DC
Start: 2022-05-31 — End: 2023-06-10

## 2022-05-31 MED ORDER — ARIPIPRAZOLE 5 MG PO TABS
5 | ORAL_TABLET | Freq: Every day | ORAL | 2 refills | Status: AC
Start: 2022-05-31 — End: ?

## 2022-06-07 ENCOUNTER — Ambulatory Visit: Payer: PRIVATE HEALTH INSURANCE | Attending: Family | Primary: Internal Medicine

## 2022-06-21 ENCOUNTER — Ambulatory Visit: Payer: PRIVATE HEALTH INSURANCE | Attending: Family | Primary: Internal Medicine

## 2022-07-03 ENCOUNTER — Encounter

## 2022-07-03 MED ORDER — SILDENAFIL CITRATE 100 MG PO TABS
100 | ORAL_TABLET | ORAL | 0 refills | Status: AC
Start: 2022-07-03 — End: ?

## 2022-07-31 ENCOUNTER — Ambulatory Visit: Payer: PRIVATE HEALTH INSURANCE | Attending: Family | Primary: Internal Medicine

## 2022-08-13 ENCOUNTER — Ambulatory Visit: Payer: PRIVATE HEALTH INSURANCE | Attending: Internal Medicine | Primary: Internal Medicine

## 2022-08-13 NOTE — Progress Notes (Deleted)
No Show

## 2022-10-11 ENCOUNTER — Telehealth
Admit: 2022-10-11 | Discharge: 2022-10-11 | Payer: PRIVATE HEALTH INSURANCE | Attending: Family Medicine | Primary: Internal Medicine

## 2022-10-11 DIAGNOSIS — U071 COVID-19: Secondary | ICD-10-CM

## 2022-10-11 MED ORDER — AZITHROMYCIN 250 MG PO TABS
250 | ORAL_TABLET | ORAL | 0 refills | Status: AC
Start: 2022-10-11 — End: 2022-10-21

## 2022-10-11 NOTE — Progress Notes (Signed)
 10/11/2022    TELEHEALTH EVALUATION -- Audio/Visual    HPI:    John Duke (DOB:  [birth year]) has requested an audio/video evaluation for the following concern(s):    Pt presents today for Positive COVID test.    Pt states that he has been feeling bad for about 2 days. Pt states that his mom and wife do home health for his house. His house tested positive but the mom and wife tested negative. Pt states he has congestion headaches and he has woke up with night sweats. Pt notes any difference with his breathing. Pt mentions his daughter had been sick and his wife has been feeling sick as well. His wife and daughter has been coughing up green phlegm. Pt mentions he has diarrhea but that started 2 days ago. He mentions he has sinus pain. He has 99 degree temperature.    Review of Systems   HENT:  Positive for congestion and sinus pain.    Gastrointestinal:  Positive for diarrhea.   Endocrine: Positive for heat intolerance.   Neurological:  Positive for headaches.       Prior to Visit Medications    Medication Sig Taking? Authorizing Provider   azithromycin  (ZITHROMAX ) 250 MG tablet 500mg  on day 1 followed by 250mg  on days 2 - 5 Yes Verta Dines, MD   sildenafil  (VIAGRA ) 100 MG tablet TAKE 1 TABLET BY MOUTH ONCE DAILY AS NEEDED FOR ERECTILE DYSFUNCTION. Yes Appa Falcao, Raquel, MD   ARIPiprazole  (ABILIFY ) 5 MG tablet TAKE 1 TABLET BY MOUTH EVERY DAY Yes Appa Falcao, Raquel, MD   mirtazapine  (REMERON ) 30 MG tablet TAKE 1 TABLET BY MOUTH EVERY DAY AT NIGHT Yes Appa Falcao, Raquel, MD   mupirocin  (BACTROBAN ) 2 % ointment APPLY TOPICALLY TO AFFECTED AREA EVERY DAY Yes Appa Falcao, Raquel, MD   lidocaine  (LIDODERM ) 5 % APPLY 1 PATCH TOPICALLY TO THE AFFECTED AREA FOR 12 HOURS A DAY AND REMOVE FOR 12 HOURS A DAY. Yes Appa Falcao, Raquel, MD   montelukast  (SINGULAIR ) 10 MG tablet Take 1 tablet by mouth daily Yes Appa Falcao, Raquel, MD   pantoprazole  (PROTONIX ) 40 MG tablet Take 1 tablet by mouth 2 times daily (before meals)  Yes Appa Falcao, Raquel, MD   albuterol  (PROVENTIL ) (2.5 MG/3ML) 0.083% nebulizer solution INHALE 1 VIAL VIA NEBULIZER EVERY 6 HOURS AS NEEDED FOR WHEEZING Yes Appa Falcao, Raquel, MD   albuterol  sulfate HFA (PROVENTIL ;VENTOLIN ;PROAIR ) 108 (90 Base) MCG/ACT inhaler INHALE 2 PUFFS BY MOUTH EVERY 4 HOURS AS NEEDED FOR WHEEZE Yes Automatic Reconciliation, Ar   pregabalin  (LYRICA ) 100 MG capsule Take 1 capsule by mouth 2 times daily for 60 days. Max Daily Amount: 200 mg  Appa Falcao, Raquel, MD   triamterene -hydroCHLOROthiazide (MAXZIDE-25) 37.5-25 MG per tablet Take 1 tablet by mouth daily  Patient not taking: Reported on 10/11/2022  Appa Falcao, Raquel, MD   albuterol  sulfate (PROAIR  RESPICLICK) 108 (90 Base) MCG/ACT aerosol powder inhalation Inhale 2 puffs into the lungs 4 times daily as needed  Automatic Reconciliation, Ar   sulfamethoxazole-trimethoprim (BACTRIM DS;SEPTRA DS) 800-160 MG per tablet Take 1 tablet by mouth 2 times daily  Patient not taking: Reported on 10/11/2022  Automatic Reconciliation, Ar       Social History     Tobacco Use    Smoking status: Every Day     Current packs/day: 0.50     Average packs/day: 0.5 packs/day for 15.0 years (7.5 ttl pk-yrs)     Types: Cigarettes, E-Cigarettes    Smokeless tobacco: Never  Substance Use Topics    Alcohol use: No    Drug use: Yes     Types: Marijuana Oda)        PHYSICAL EXAMINATION:  [ INSTRUCTIONS:  [x]  Indicates a positive item  []  Indicates a negative item  -- DELETE ALL ITEMS NOT EXAMINED]  Vital Signs: (As obtained by patient/caregiver or practitioner observation)    Blood pressure-  Heart rate-    Respiratory rate-    Temperature-  Pulse oximetry-     Constitutional: [x]  Appears well-developed and well-nourished [x]  No apparent distress      []  Abnormal-   Mental status  [x]  Alert and awake  [x]  Oriented to person/place/time [x] Able to follow commands      Eyes:  EOM    [x]   Normal  []  Abnormal-  Sclera  [x]   Normal  []  Abnormal -          Discharge [x]   None visible  []  Abnormal -    HENT:   [x]  Normocephalic, atraumatic.  []  Abnormal   [x]  Mouth/Throat: Mucous membranes are moist.     External Ears [x]  Normal  []  Abnormal-     Neck: [x]  No visualized mass     Pulmonary/Chest: [x]  Respiratory effort normal.  [x]  No visualized signs of difficulty breathing or respiratory distress        []  Abnormal-      Musculoskeletal:   [x]  Normal gait with no signs of ataxia         [x]  Normal range of motion of neck        []  Abnormal-       Neurological:        [x]  No Facial Asymmetry (Cranial nerve 7 motor function) (limited exam to video visit)          [x]  No gaze palsy        []  Abnormal-         Skin:        [x]  No significant exanthematous lesions or discoloration noted on facial skin         []  Abnormal-            Psychiatric:       [x]  Normal Affect [x]  No Hallucinations        []  Abnormal-     Other pertinent observable physical exam findings-     ASSESSMENT/PLAN:  1. Acute non-recurrent maxillary sinusitis  I prescribed the pt Zithromax  250 mg daily.  - azithromycin  (ZITHROMAX ) 250 MG tablet; 500mg  on day 1 followed by 250mg  on days 2 - 5  Dispense: 6 tablet; Refill: 0    2. COVID-19  I advised the pt he is having minor symptoms. He should continue to stay hydrated. I advised the his wife and daughter are sick and should be tested.      John Duke, was evaluated through a synchronous (real-time) audio-video encounter. The patient (or guardian if applicable) is aware that this is a billable service, which includes applicable co-pays. This Virtual Visit was conducted with patient's (and/or legal guardian's) consent. Patient identification was verified, and a caregiver was present when appropriate.   The patient was located at Home: [address] [place] [address]  [address] [place] [address]  Provider was located at [employer] (Appt Dept): [address]  [address]  [place],  [place] [address]  Confirm you are appropriately licensed, registered, or certified to  deliver care in the state where the patient is located as  indicated above. If you are not or unsure, please re-schedule the visit: Yes, I confirm.       Total time spent on this encounter: Not billed by time    --Gardiner Brooks on 10/11/2022 at 3:26 PM    An electronic signature was used to authenticate this note.  I have reviewed the note documented by the scribe.  The services provided are my own.  The documentation is accurate. Levorn Bonus, MD

## 2022-10-11 NOTE — Progress Notes (Signed)
"  Have you been to the ER, urgent care clinic since your last visit?  Hospitalized since your last visit?"    NO    "Have you seen or consulted any other health care providers outside our system since your last visit?"    NO        Click Here for Release of Records Request

## 2023-03-04 ENCOUNTER — Inpatient Hospital Stay
Admit: 2023-03-04 | Discharge: 2023-03-04 | Disposition: A | Discharge status: Home / Self Care | Payer: PRIVATE HEALTH INSURANCE

## 2023-03-04 DIAGNOSIS — F419 Anxiety disorder, unspecified: Secondary | ICD-10-CM

## 2023-03-04 MED ORDER — HYDROXYZINE PAMOATE 50 MG PO CAPS
50 | ORAL_CAPSULE | Freq: Three times a day (TID) | ORAL | 0 refills | Status: AC | PRN
Start: 2023-03-04 — End: ?
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
## 2023-03-04 NOTE — ED Notes (Signed)
Pt presents ambulatory to ED stating [facility] for medical [facility] before they could provide psych services. Pt denies SI/HI or AVH. Pt is alert and oriented x 4, RR even and unlabored, skin is warm and dry. Assesment completed and pt updated on plan of care.       Emergency Department Nursing Plan of Care       The Nursing Plan of Care is developed from the Nursing assessment and Emergency Department Attending provider initial evaluation.  The plan of care may be reviewed in the "ED Provider note".    The Plan of Care was developed with the following considerations:   Patient / Family readiness to learn indicated [id number] understanding  Persons(s) to be included in education: patient  Barriers to Learning/Limitations:None    Signed     Shauna Hugh, RN    03/04/2023   2:24 PM

## 2023-03-04 NOTE — ED Triage Notes (Signed)
PT came in the ED with complaints of anxiety. [facility] stated [facility] and was told to be seen in ED , pt reports he runs out of his medicine the Remeron. Pt reports he had history of PTSD, depression, Bipolar and anxiety. PT denies SI/HI at this time.

## 2023-03-04 NOTE — ED Notes (Signed)
 Discharge instructions were given to the patient by Rene Kocher, RN  .     The patient left the Emergency Department ambulatory, alert and oriented and in no acute distress with 1 prescriptions. The patient was encouraged to call or return to the ED for worsening issues or problems and was encouraged to schedule a follow up appointment for continuing care. Patient discharged home ambulatory with a steady gait.    The patient verbalized understanding of discharge instructions and prescriptions, all questions were answered. The patient has no further concerns at this time.

## 2023-06-07 ENCOUNTER — Encounter

## 2023-06-10 MED ORDER — MIRTAZAPINE 30 MG PO TABS
30 | ORAL_TABLET | Freq: Every evening | ORAL | 2 refills | 30.00000 days | Status: AC
Start: 2023-06-10 — End: ?

## 2023-08-26 ENCOUNTER — Ambulatory Visit: Payer: Medicaid (Managed Care) | Attending: Internal Medicine | Primary: Internal Medicine

## 2023-08-26 NOTE — Telephone Encounter (Signed)
-----   Message from Purdin P sent at 08/26/2023  1:03 PM EDT -----  Regarding: [facility] Appointment Request  [facility] Appointment Request    Patient needs appointment for [name] Appointment Type: Annual Visit.    Patient Requested Dates(s):any day within this week  Patient Requested Time:after 2pm  Provider Name:Appa Falcao, Raquel, MD    Reason for Appointment Request: Established Patient - No appointments available during search  --------------------------------------------------------------------------------------------------------------------------    Relationship to Patient: Self     Call Back Information: OK to leave message on voicemail  Preferred Call Back Number: Phone [phone]

## 2023-08-27 NOTE — Telephone Encounter (Signed)
 Swp, scheduled CPE 9/3

## 2023-09-02 ENCOUNTER — Encounter

## 2023-09-02 NOTE — Telephone Encounter (Signed)
 Spoke to pt's wife and she states pt has been unable to eat or drink anything for a few days.  Pt's wife states pt is experiencing loss of voice, coughing with mucus, congestion, tightness in chest, loss of appetite and extreme weakness.    Pt's wife was informed pt needs to go to ER or UC to be seen due to symptoms.    Pt's wife agrees and states she will take him.

## 2023-09-02 NOTE — Telephone Encounter (Signed)
 Pt has URI with yellow and green thick mucous.      Pt was told to call pcp with the questions that they did on line.    She has been giving mucous relief 1200 MG max strength tablets every six hours.     Drinking hot tea w/lemon & honey.    [facility]    Pt also needs refill on inhaler    albuterol  sulfate HFA (PROVENTIL ;VENTOLIN ;PROAIR ) 108 (90 Base) MCG/ACT inhaler    [facility] as [facility].

## 2023-09-03 NOTE — Telephone Encounter (Signed)
 Called, Spoke with Patients' spouse John Duke.  Received two pt identifiers    Informed John Duke we can't just refill Amoxicillin ; it's an antibiotic. Per last note patient was supposed be taken to ER; doesn't appear as if patient went. John Duke states she did not take patient to the ER because she got a new puppy and doesn't think the drive is valid enough to take him with the puppy at home. Informed John Duke it is important to do as they were advised per last note to avoid patient having any complications, especially if treatment is needed. Pt spouse verbalizes understanding of the instructions and has no further questions at this time.

## 2023-09-03 NOTE — Telephone Encounter (Signed)
 Wife is calling and asking for the refill.      They would like the amoxicillin  today please    [phone]    Please call Ashley to let her know.

## 2023-10-02 ENCOUNTER — Ambulatory Visit: Attending: Internal Medicine | Primary: Internal Medicine
